# Patient Record
Sex: Female | Born: 1945 | Race: White | Hispanic: No | Marital: Married | Smoking: Former smoker
Health system: Southern US, Community
[De-identification: ages and names within clinical notes are randomized; demographics above are authoritative.]

---

## 2000-10-11 ENCOUNTER — Encounter: Payer: Self-pay | Admitting: Family Medicine

## 2000-10-11 ENCOUNTER — Ambulatory Visit (HOSPITAL_COMMUNITY): Admission: RE | Admit: 2000-10-11 | Discharge: 2000-10-11 | Payer: Self-pay | Admitting: Family Medicine

## 2003-09-01 ENCOUNTER — Other Ambulatory Visit: Admission: RE | Admit: 2003-09-01 | Discharge: 2003-09-01 | Payer: Self-pay | Admitting: Family Medicine

## 2007-01-07 ENCOUNTER — Other Ambulatory Visit: Admission: RE | Admit: 2007-01-07 | Discharge: 2007-01-07 | Payer: Self-pay | Admitting: Family Medicine

## 2010-01-12 ENCOUNTER — Ambulatory Visit (HOSPITAL_COMMUNITY): Admission: RE | Admit: 2010-01-12 | Discharge: 2010-01-12 | Payer: Self-pay | Admitting: Orthopedic Surgery

## 2010-07-20 LAB — BASIC METABOLIC PANEL
BUN: 16 mg/dL (ref 6–23)
CO2: 28 mEq/L (ref 19–32)
Calcium: 9.6 mg/dL (ref 8.4–10.5)
Chloride: 106 mEq/L (ref 96–112)
Creatinine, Ser: 0.69 mg/dL (ref 0.4–1.2)
GFR calc Af Amer: >60 mL/min (ref >60)
GFR calc non Af Amer: >60 mL/min (ref >60)
Glucose, Bld: 106 mg/dL — ABNORMAL HIGH (ref 70–99)
Potassium: 4.3 mEq/L (ref 3.5–5.1)
Sodium: 141 mEq/L (ref 135–145)

## 2010-07-20 LAB — PROTIME-INR
INR: 0.95 (ref 0.00–1.49)
Prothrombin Time: 12.9 seconds (ref 11.6–15.2)

## 2010-07-20 LAB — CBC
HCT: 43.6 % (ref 36.0–46.0)
Hemoglobin: 14.6 g/dL (ref 12.0–15.0)
MCH: 30.9 pg (ref 26.0–34.0)
MCHC: 33.5 g/dL (ref 30.0–36.0)
MCV: 92.4 fL (ref 78.0–100.0)
Platelets: 245 10*3/uL (ref 150–400)
RBC: 4.72 MIL/uL (ref 3.87–5.11)
RDW: 12.3 % (ref 11.5–15.5)
WBC: 6.7 10*3/uL (ref 4.0–10.5)

## 2010-07-20 LAB — SURGICAL PCR SCREEN
MRSA, PCR: NEGATIVE
Staphylococcus aureus: NEGATIVE

## 2012-01-24 ENCOUNTER — Other Ambulatory Visit: Payer: Self-pay | Admitting: Family Medicine

## 2012-01-24 ENCOUNTER — Other Ambulatory Visit (HOSPITAL_COMMUNITY)
Admission: RE | Admit: 2012-01-24 | Discharge: 2012-01-24 | Disposition: A | Discharge status: Home / Self Care | Payer: BC Managed Care – PPO | Source: Ambulatory Visit | Attending: Family Medicine | Admitting: Family Medicine

## 2012-01-24 DIAGNOSIS — Z0000 Encounter for general adult medical examination without abnormal findings: Secondary | ICD-10-CM | POA: Insufficient documentation

## 2013-02-09 DIAGNOSIS — M999 Biomechanical lesion, unspecified: Secondary | ICD-10-CM | POA: Diagnosis not present

## 2013-02-09 DIAGNOSIS — M542 Cervicalgia: Secondary | ICD-10-CM | POA: Diagnosis not present

## 2013-02-09 DIAGNOSIS — M5137 Other intervertebral disc degeneration, lumbosacral region: Secondary | ICD-10-CM | POA: Diagnosis not present

## 2013-02-09 DIAGNOSIS — M545 Low back pain, unspecified: Secondary | ICD-10-CM | POA: Diagnosis not present

## 2013-02-09 DIAGNOSIS — M9981 Other biomechanical lesions of cervical region: Secondary | ICD-10-CM | POA: Diagnosis not present

## 2013-02-19 DIAGNOSIS — M999 Biomechanical lesion, unspecified: Secondary | ICD-10-CM | POA: Diagnosis not present

## 2013-02-19 DIAGNOSIS — M545 Low back pain, unspecified: Secondary | ICD-10-CM | POA: Diagnosis not present

## 2013-02-19 DIAGNOSIS — M5137 Other intervertebral disc degeneration, lumbosacral region: Secondary | ICD-10-CM | POA: Diagnosis not present

## 2013-02-19 DIAGNOSIS — M542 Cervicalgia: Secondary | ICD-10-CM | POA: Diagnosis not present

## 2013-02-19 DIAGNOSIS — M9981 Other biomechanical lesions of cervical region: Secondary | ICD-10-CM | POA: Diagnosis not present

## 2013-03-06 DIAGNOSIS — Z23 Encounter for immunization: Secondary | ICD-10-CM | POA: Diagnosis not present

## 2013-03-09 DIAGNOSIS — M545 Low back pain, unspecified: Secondary | ICD-10-CM | POA: Diagnosis not present

## 2013-03-09 DIAGNOSIS — M9981 Other biomechanical lesions of cervical region: Secondary | ICD-10-CM | POA: Diagnosis not present

## 2013-03-09 DIAGNOSIS — M5137 Other intervertebral disc degeneration, lumbosacral region: Secondary | ICD-10-CM | POA: Diagnosis not present

## 2013-03-09 DIAGNOSIS — M999 Biomechanical lesion, unspecified: Secondary | ICD-10-CM | POA: Diagnosis not present

## 2013-03-09 DIAGNOSIS — M542 Cervicalgia: Secondary | ICD-10-CM | POA: Diagnosis not present

## 2013-03-26 DIAGNOSIS — H6690 Otitis media, unspecified, unspecified ear: Secondary | ICD-10-CM | POA: Diagnosis not present

## 2013-03-26 DIAGNOSIS — J019 Acute sinusitis, unspecified: Secondary | ICD-10-CM | POA: Diagnosis not present

## 2013-03-31 DIAGNOSIS — M999 Biomechanical lesion, unspecified: Secondary | ICD-10-CM | POA: Diagnosis not present

## 2013-03-31 DIAGNOSIS — M545 Low back pain, unspecified: Secondary | ICD-10-CM | POA: Diagnosis not present

## 2013-03-31 DIAGNOSIS — M5137 Other intervertebral disc degeneration, lumbosacral region: Secondary | ICD-10-CM | POA: Diagnosis not present

## 2013-03-31 DIAGNOSIS — M542 Cervicalgia: Secondary | ICD-10-CM | POA: Diagnosis not present

## 2013-03-31 DIAGNOSIS — M9981 Other biomechanical lesions of cervical region: Secondary | ICD-10-CM | POA: Diagnosis not present

## 2013-07-29 DIAGNOSIS — M545 Low back pain, unspecified: Secondary | ICD-10-CM | POA: Diagnosis not present

## 2013-07-29 DIAGNOSIS — M999 Biomechanical lesion, unspecified: Secondary | ICD-10-CM | POA: Diagnosis not present

## 2013-07-29 DIAGNOSIS — M9981 Other biomechanical lesions of cervical region: Secondary | ICD-10-CM | POA: Diagnosis not present

## 2013-07-29 DIAGNOSIS — M5137 Other intervertebral disc degeneration, lumbosacral region: Secondary | ICD-10-CM | POA: Diagnosis not present

## 2013-07-29 DIAGNOSIS — M542 Cervicalgia: Secondary | ICD-10-CM | POA: Diagnosis not present

## 2013-08-03 DIAGNOSIS — M9981 Other biomechanical lesions of cervical region: Secondary | ICD-10-CM | POA: Diagnosis not present

## 2013-08-03 DIAGNOSIS — M542 Cervicalgia: Secondary | ICD-10-CM | POA: Diagnosis not present

## 2013-08-03 DIAGNOSIS — M999 Biomechanical lesion, unspecified: Secondary | ICD-10-CM | POA: Diagnosis not present

## 2013-08-03 DIAGNOSIS — M5137 Other intervertebral disc degeneration, lumbosacral region: Secondary | ICD-10-CM | POA: Diagnosis not present

## 2013-08-03 DIAGNOSIS — M545 Low back pain, unspecified: Secondary | ICD-10-CM | POA: Diagnosis not present

## 2013-10-05 DIAGNOSIS — Z1231 Encounter for screening mammogram for malignant neoplasm of breast: Secondary | ICD-10-CM | POA: Diagnosis not present

## 2013-10-21 DIAGNOSIS — K219 Gastro-esophageal reflux disease without esophagitis: Secondary | ICD-10-CM | POA: Diagnosis not present

## 2013-10-21 DIAGNOSIS — Z0000 Encounter for general adult medical examination without abnormal findings: Secondary | ICD-10-CM | POA: Diagnosis not present

## 2013-10-21 DIAGNOSIS — M949 Disorder of cartilage, unspecified: Secondary | ICD-10-CM | POA: Diagnosis not present

## 2013-10-21 DIAGNOSIS — E559 Vitamin D deficiency, unspecified: Secondary | ICD-10-CM | POA: Diagnosis not present

## 2013-10-21 DIAGNOSIS — M899 Disorder of bone, unspecified: Secondary | ICD-10-CM | POA: Diagnosis not present

## 2013-10-21 DIAGNOSIS — E782 Mixed hyperlipidemia: Secondary | ICD-10-CM | POA: Diagnosis not present

## 2013-10-21 DIAGNOSIS — Z1211 Encounter for screening for malignant neoplasm of colon: Secondary | ICD-10-CM | POA: Diagnosis not present

## 2013-10-21 DIAGNOSIS — Z1331 Encounter for screening for depression: Secondary | ICD-10-CM | POA: Diagnosis not present

## 2013-10-21 DIAGNOSIS — F411 Generalized anxiety disorder: Secondary | ICD-10-CM | POA: Diagnosis not present

## 2013-10-23 DIAGNOSIS — Z23 Encounter for immunization: Secondary | ICD-10-CM | POA: Diagnosis not present

## 2013-11-05 ENCOUNTER — Other Ambulatory Visit: Payer: Self-pay | Admitting: Obstetrics & Gynecology

## 2013-11-05 DIAGNOSIS — N7689 Other specified inflammation of vagina and vulva: Secondary | ICD-10-CM | POA: Diagnosis not present

## 2013-11-05 DIAGNOSIS — Z01419 Encounter for gynecological examination (general) (routine) without abnormal findings: Secondary | ICD-10-CM | POA: Diagnosis not present

## 2013-11-05 DIAGNOSIS — N949 Unspecified condition associated with female genital organs and menstrual cycle: Secondary | ICD-10-CM | POA: Diagnosis not present

## 2013-11-05 DIAGNOSIS — M899 Disorder of bone, unspecified: Secondary | ICD-10-CM | POA: Diagnosis not present

## 2013-11-05 DIAGNOSIS — N72 Inflammatory disease of cervix uteri: Secondary | ICD-10-CM | POA: Diagnosis not present

## 2013-11-05 DIAGNOSIS — M949 Disorder of cartilage, unspecified: Secondary | ICD-10-CM | POA: Diagnosis not present

## 2013-11-05 DIAGNOSIS — L940 Localized scleroderma [morphea]: Secondary | ICD-10-CM | POA: Diagnosis not present

## 2013-11-13 DIAGNOSIS — K3189 Other diseases of stomach and duodenum: Secondary | ICD-10-CM | POA: Diagnosis not present

## 2013-11-13 DIAGNOSIS — H900 Conductive hearing loss, bilateral: Secondary | ICD-10-CM | POA: Diagnosis not present

## 2013-11-13 DIAGNOSIS — K219 Gastro-esophageal reflux disease without esophagitis: Secondary | ICD-10-CM | POA: Diagnosis not present

## 2013-11-13 DIAGNOSIS — Z1211 Encounter for screening for malignant neoplasm of colon: Secondary | ICD-10-CM | POA: Diagnosis not present

## 2013-12-11 DIAGNOSIS — N949 Unspecified condition associated with female genital organs and menstrual cycle: Secondary | ICD-10-CM | POA: Diagnosis not present

## 2013-12-11 DIAGNOSIS — L940 Localized scleroderma [morphea]: Secondary | ICD-10-CM | POA: Diagnosis not present

## 2013-12-18 ENCOUNTER — Other Ambulatory Visit: Payer: Self-pay | Admitting: Gastroenterology

## 2013-12-18 DIAGNOSIS — K229 Disease of esophagus, unspecified: Secondary | ICD-10-CM | POA: Diagnosis not present

## 2013-12-18 DIAGNOSIS — D131 Benign neoplasm of stomach: Secondary | ICD-10-CM | POA: Diagnosis not present

## 2013-12-18 DIAGNOSIS — R1013 Epigastric pain: Secondary | ICD-10-CM | POA: Diagnosis not present

## 2013-12-18 DIAGNOSIS — K219 Gastro-esophageal reflux disease without esophagitis: Secondary | ICD-10-CM | POA: Diagnosis not present

## 2013-12-21 DIAGNOSIS — N949 Unspecified condition associated with female genital organs and menstrual cycle: Secondary | ICD-10-CM | POA: Diagnosis not present

## 2013-12-23 DIAGNOSIS — E559 Vitamin D deficiency, unspecified: Secondary | ICD-10-CM | POA: Diagnosis not present

## 2014-02-21 DIAGNOSIS — Z23 Encounter for immunization: Secondary | ICD-10-CM | POA: Diagnosis not present

## 2014-03-02 DIAGNOSIS — D239 Other benign neoplasm of skin, unspecified: Secondary | ICD-10-CM | POA: Diagnosis not present

## 2014-03-02 DIAGNOSIS — L900 Lichen sclerosus et atrophicus: Secondary | ICD-10-CM | POA: Diagnosis not present

## 2014-04-09 DIAGNOSIS — L820 Inflamed seborrheic keratosis: Secondary | ICD-10-CM | POA: Diagnosis not present

## 2014-06-11 DIAGNOSIS — E669 Obesity, unspecified: Secondary | ICD-10-CM | POA: Diagnosis not present

## 2014-06-11 DIAGNOSIS — Z6833 Body mass index (BMI) 33.0-33.9, adult: Secondary | ICD-10-CM | POA: Diagnosis not present

## 2014-06-11 DIAGNOSIS — Z713 Dietary counseling and surveillance: Secondary | ICD-10-CM | POA: Diagnosis not present

## 2014-06-11 DIAGNOSIS — R102 Pelvic and perineal pain: Secondary | ICD-10-CM | POA: Diagnosis not present

## 2014-06-11 DIAGNOSIS — L900 Lichen sclerosus et atrophicus: Secondary | ICD-10-CM | POA: Diagnosis not present

## 2014-06-24 DIAGNOSIS — K219 Gastro-esophageal reflux disease without esophagitis: Secondary | ICD-10-CM | POA: Diagnosis not present

## 2014-07-06 DIAGNOSIS — M9901 Segmental and somatic dysfunction of cervical region: Secondary | ICD-10-CM | POA: Diagnosis not present

## 2014-07-06 DIAGNOSIS — M542 Cervicalgia: Secondary | ICD-10-CM | POA: Diagnosis not present

## 2014-07-06 DIAGNOSIS — M9904 Segmental and somatic dysfunction of sacral region: Secondary | ICD-10-CM | POA: Diagnosis not present

## 2014-07-06 DIAGNOSIS — M545 Low back pain: Secondary | ICD-10-CM | POA: Diagnosis not present

## 2014-07-06 DIAGNOSIS — M9903 Segmental and somatic dysfunction of lumbar region: Secondary | ICD-10-CM | POA: Diagnosis not present

## 2014-07-06 DIAGNOSIS — M5136 Other intervertebral disc degeneration, lumbar region: Secondary | ICD-10-CM | POA: Diagnosis not present

## 2014-07-07 DIAGNOSIS — M545 Low back pain: Secondary | ICD-10-CM | POA: Diagnosis not present

## 2014-07-07 DIAGNOSIS — M542 Cervicalgia: Secondary | ICD-10-CM | POA: Diagnosis not present

## 2014-07-07 DIAGNOSIS — M9901 Segmental and somatic dysfunction of cervical region: Secondary | ICD-10-CM | POA: Diagnosis not present

## 2014-07-07 DIAGNOSIS — M9903 Segmental and somatic dysfunction of lumbar region: Secondary | ICD-10-CM | POA: Diagnosis not present

## 2014-07-07 DIAGNOSIS — M9904 Segmental and somatic dysfunction of sacral region: Secondary | ICD-10-CM | POA: Diagnosis not present

## 2014-07-07 DIAGNOSIS — M5136 Other intervertebral disc degeneration, lumbar region: Secondary | ICD-10-CM | POA: Diagnosis not present

## 2014-07-08 DIAGNOSIS — H2513 Age-related nuclear cataract, bilateral: Secondary | ICD-10-CM | POA: Diagnosis not present

## 2014-07-08 DIAGNOSIS — H02834 Dermatochalasis of left upper eyelid: Secondary | ICD-10-CM | POA: Diagnosis not present

## 2014-07-08 DIAGNOSIS — H5203 Hypermetropia, bilateral: Secondary | ICD-10-CM | POA: Diagnosis not present

## 2014-07-08 DIAGNOSIS — H02831 Dermatochalasis of right upper eyelid: Secondary | ICD-10-CM | POA: Diagnosis not present

## 2014-07-08 DIAGNOSIS — H52223 Regular astigmatism, bilateral: Secondary | ICD-10-CM | POA: Diagnosis not present

## 2014-07-08 DIAGNOSIS — H524 Presbyopia: Secondary | ICD-10-CM | POA: Diagnosis not present

## 2014-07-12 DIAGNOSIS — M9904 Segmental and somatic dysfunction of sacral region: Secondary | ICD-10-CM | POA: Diagnosis not present

## 2014-07-12 DIAGNOSIS — M9903 Segmental and somatic dysfunction of lumbar region: Secondary | ICD-10-CM | POA: Diagnosis not present

## 2014-07-12 DIAGNOSIS — M545 Low back pain: Secondary | ICD-10-CM | POA: Diagnosis not present

## 2014-07-12 DIAGNOSIS — M5136 Other intervertebral disc degeneration, lumbar region: Secondary | ICD-10-CM | POA: Diagnosis not present

## 2014-07-12 DIAGNOSIS — M9901 Segmental and somatic dysfunction of cervical region: Secondary | ICD-10-CM | POA: Diagnosis not present

## 2014-07-12 DIAGNOSIS — M542 Cervicalgia: Secondary | ICD-10-CM | POA: Diagnosis not present

## 2014-07-13 DIAGNOSIS — M545 Low back pain: Secondary | ICD-10-CM | POA: Diagnosis not present

## 2014-07-13 DIAGNOSIS — M542 Cervicalgia: Secondary | ICD-10-CM | POA: Diagnosis not present

## 2014-07-13 DIAGNOSIS — M9903 Segmental and somatic dysfunction of lumbar region: Secondary | ICD-10-CM | POA: Diagnosis not present

## 2014-07-13 DIAGNOSIS — M9904 Segmental and somatic dysfunction of sacral region: Secondary | ICD-10-CM | POA: Diagnosis not present

## 2014-07-13 DIAGNOSIS — M5136 Other intervertebral disc degeneration, lumbar region: Secondary | ICD-10-CM | POA: Diagnosis not present

## 2014-07-13 DIAGNOSIS — M9901 Segmental and somatic dysfunction of cervical region: Secondary | ICD-10-CM | POA: Diagnosis not present

## 2014-07-15 DIAGNOSIS — H02831 Dermatochalasis of right upper eyelid: Secondary | ICD-10-CM | POA: Diagnosis not present

## 2014-07-15 DIAGNOSIS — H02834 Dermatochalasis of left upper eyelid: Secondary | ICD-10-CM | POA: Diagnosis not present

## 2014-07-19 DIAGNOSIS — M5136 Other intervertebral disc degeneration, lumbar region: Secondary | ICD-10-CM | POA: Diagnosis not present

## 2014-07-19 DIAGNOSIS — M9903 Segmental and somatic dysfunction of lumbar region: Secondary | ICD-10-CM | POA: Diagnosis not present

## 2014-07-19 DIAGNOSIS — M9904 Segmental and somatic dysfunction of sacral region: Secondary | ICD-10-CM | POA: Diagnosis not present

## 2014-07-19 DIAGNOSIS — M545 Low back pain: Secondary | ICD-10-CM | POA: Diagnosis not present

## 2014-07-19 DIAGNOSIS — M9901 Segmental and somatic dysfunction of cervical region: Secondary | ICD-10-CM | POA: Diagnosis not present

## 2014-07-19 DIAGNOSIS — M542 Cervicalgia: Secondary | ICD-10-CM | POA: Diagnosis not present

## 2014-07-21 DIAGNOSIS — M9903 Segmental and somatic dysfunction of lumbar region: Secondary | ICD-10-CM | POA: Diagnosis not present

## 2014-07-21 DIAGNOSIS — M9901 Segmental and somatic dysfunction of cervical region: Secondary | ICD-10-CM | POA: Diagnosis not present

## 2014-07-21 DIAGNOSIS — M9904 Segmental and somatic dysfunction of sacral region: Secondary | ICD-10-CM | POA: Diagnosis not present

## 2014-07-21 DIAGNOSIS — M5136 Other intervertebral disc degeneration, lumbar region: Secondary | ICD-10-CM | POA: Diagnosis not present

## 2014-07-21 DIAGNOSIS — M545 Low back pain: Secondary | ICD-10-CM | POA: Diagnosis not present

## 2014-07-21 DIAGNOSIS — M542 Cervicalgia: Secondary | ICD-10-CM | POA: Diagnosis not present

## 2014-07-27 DIAGNOSIS — M5136 Other intervertebral disc degeneration, lumbar region: Secondary | ICD-10-CM | POA: Diagnosis not present

## 2014-07-27 DIAGNOSIS — M9903 Segmental and somatic dysfunction of lumbar region: Secondary | ICD-10-CM | POA: Diagnosis not present

## 2014-07-27 DIAGNOSIS — M542 Cervicalgia: Secondary | ICD-10-CM | POA: Diagnosis not present

## 2014-07-27 DIAGNOSIS — M9904 Segmental and somatic dysfunction of sacral region: Secondary | ICD-10-CM | POA: Diagnosis not present

## 2014-07-27 DIAGNOSIS — M9901 Segmental and somatic dysfunction of cervical region: Secondary | ICD-10-CM | POA: Diagnosis not present

## 2014-07-27 DIAGNOSIS — M545 Low back pain: Secondary | ICD-10-CM | POA: Diagnosis not present

## 2014-08-03 DIAGNOSIS — M9903 Segmental and somatic dysfunction of lumbar region: Secondary | ICD-10-CM | POA: Diagnosis not present

## 2014-08-03 DIAGNOSIS — M9901 Segmental and somatic dysfunction of cervical region: Secondary | ICD-10-CM | POA: Diagnosis not present

## 2014-08-03 DIAGNOSIS — M5136 Other intervertebral disc degeneration, lumbar region: Secondary | ICD-10-CM | POA: Diagnosis not present

## 2014-08-03 DIAGNOSIS — M545 Low back pain: Secondary | ICD-10-CM | POA: Diagnosis not present

## 2014-08-03 DIAGNOSIS — M542 Cervicalgia: Secondary | ICD-10-CM | POA: Diagnosis not present

## 2014-08-03 DIAGNOSIS — M9904 Segmental and somatic dysfunction of sacral region: Secondary | ICD-10-CM | POA: Diagnosis not present

## 2014-08-11 DIAGNOSIS — M5136 Other intervertebral disc degeneration, lumbar region: Secondary | ICD-10-CM | POA: Diagnosis not present

## 2014-08-11 DIAGNOSIS — M545 Low back pain: Secondary | ICD-10-CM | POA: Diagnosis not present

## 2014-08-11 DIAGNOSIS — M9903 Segmental and somatic dysfunction of lumbar region: Secondary | ICD-10-CM | POA: Diagnosis not present

## 2014-08-11 DIAGNOSIS — M9904 Segmental and somatic dysfunction of sacral region: Secondary | ICD-10-CM | POA: Diagnosis not present

## 2014-08-11 DIAGNOSIS — M9901 Segmental and somatic dysfunction of cervical region: Secondary | ICD-10-CM | POA: Diagnosis not present

## 2014-08-11 DIAGNOSIS — M542 Cervicalgia: Secondary | ICD-10-CM | POA: Diagnosis not present

## 2014-09-06 DIAGNOSIS — H02403 Unspecified ptosis of bilateral eyelids: Secondary | ICD-10-CM | POA: Diagnosis not present

## 2014-10-11 DIAGNOSIS — H02423 Myogenic ptosis of bilateral eyelids: Secondary | ICD-10-CM | POA: Diagnosis not present

## 2014-10-11 DIAGNOSIS — H02834 Dermatochalasis of left upper eyelid: Secondary | ICD-10-CM | POA: Diagnosis not present

## 2014-10-11 DIAGNOSIS — H02831 Dermatochalasis of right upper eyelid: Secondary | ICD-10-CM | POA: Diagnosis not present

## 2014-10-11 DIAGNOSIS — H02403 Unspecified ptosis of bilateral eyelids: Secondary | ICD-10-CM | POA: Diagnosis not present

## 2014-11-30 DIAGNOSIS — Z1231 Encounter for screening mammogram for malignant neoplasm of breast: Secondary | ICD-10-CM | POA: Diagnosis not present

## 2015-01-05 DIAGNOSIS — M545 Low back pain: Secondary | ICD-10-CM | POA: Diagnosis not present

## 2015-01-05 DIAGNOSIS — M9901 Segmental and somatic dysfunction of cervical region: Secondary | ICD-10-CM | POA: Diagnosis not present

## 2015-01-05 DIAGNOSIS — M542 Cervicalgia: Secondary | ICD-10-CM | POA: Diagnosis not present

## 2015-01-05 DIAGNOSIS — M5136 Other intervertebral disc degeneration, lumbar region: Secondary | ICD-10-CM | POA: Diagnosis not present

## 2015-01-05 DIAGNOSIS — M9903 Segmental and somatic dysfunction of lumbar region: Secondary | ICD-10-CM | POA: Diagnosis not present

## 2015-01-05 DIAGNOSIS — M9904 Segmental and somatic dysfunction of sacral region: Secondary | ICD-10-CM | POA: Diagnosis not present

## 2015-01-06 DIAGNOSIS — M542 Cervicalgia: Secondary | ICD-10-CM | POA: Diagnosis not present

## 2015-01-06 DIAGNOSIS — M9904 Segmental and somatic dysfunction of sacral region: Secondary | ICD-10-CM | POA: Diagnosis not present

## 2015-01-06 DIAGNOSIS — M9901 Segmental and somatic dysfunction of cervical region: Secondary | ICD-10-CM | POA: Diagnosis not present

## 2015-01-06 DIAGNOSIS — M5136 Other intervertebral disc degeneration, lumbar region: Secondary | ICD-10-CM | POA: Diagnosis not present

## 2015-01-06 DIAGNOSIS — M545 Low back pain: Secondary | ICD-10-CM | POA: Diagnosis not present

## 2015-01-06 DIAGNOSIS — M9903 Segmental and somatic dysfunction of lumbar region: Secondary | ICD-10-CM | POA: Diagnosis not present

## 2015-01-12 DIAGNOSIS — Z23 Encounter for immunization: Secondary | ICD-10-CM | POA: Diagnosis not present

## 2015-01-12 DIAGNOSIS — L237 Allergic contact dermatitis due to plants, except food: Secondary | ICD-10-CM | POA: Diagnosis not present

## 2015-01-31 DIAGNOSIS — L237 Allergic contact dermatitis due to plants, except food: Secondary | ICD-10-CM | POA: Diagnosis not present

## 2015-01-31 DIAGNOSIS — K219 Gastro-esophageal reflux disease without esophagitis: Secondary | ICD-10-CM | POA: Diagnosis not present

## 2015-01-31 DIAGNOSIS — Z1211 Encounter for screening for malignant neoplasm of colon: Secondary | ICD-10-CM | POA: Diagnosis not present

## 2015-02-01 ENCOUNTER — Other Ambulatory Visit: Payer: Self-pay | Admitting: Physician Assistant

## 2015-02-01 DIAGNOSIS — M545 Low back pain: Secondary | ICD-10-CM | POA: Diagnosis not present

## 2015-02-01 DIAGNOSIS — L309 Dermatitis, unspecified: Secondary | ICD-10-CM | POA: Diagnosis not present

## 2015-02-01 DIAGNOSIS — M542 Cervicalgia: Secondary | ICD-10-CM | POA: Diagnosis not present

## 2015-02-01 DIAGNOSIS — M9903 Segmental and somatic dysfunction of lumbar region: Secondary | ICD-10-CM | POA: Diagnosis not present

## 2015-02-01 DIAGNOSIS — D485 Neoplasm of uncertain behavior of skin: Secondary | ICD-10-CM | POA: Diagnosis not present

## 2015-02-01 DIAGNOSIS — M9904 Segmental and somatic dysfunction of sacral region: Secondary | ICD-10-CM | POA: Diagnosis not present

## 2015-02-01 DIAGNOSIS — M5136 Other intervertebral disc degeneration, lumbar region: Secondary | ICD-10-CM | POA: Diagnosis not present

## 2015-02-01 DIAGNOSIS — B078 Other viral warts: Secondary | ICD-10-CM | POA: Diagnosis not present

## 2015-02-01 DIAGNOSIS — M9901 Segmental and somatic dysfunction of cervical region: Secondary | ICD-10-CM | POA: Diagnosis not present

## 2015-03-09 DIAGNOSIS — K219 Gastro-esophageal reflux disease without esophagitis: Secondary | ICD-10-CM | POA: Diagnosis not present

## 2015-03-09 DIAGNOSIS — G4700 Insomnia, unspecified: Secondary | ICD-10-CM | POA: Diagnosis not present

## 2015-03-09 DIAGNOSIS — E559 Vitamin D deficiency, unspecified: Secondary | ICD-10-CM | POA: Diagnosis not present

## 2015-03-09 DIAGNOSIS — M8580 Other specified disorders of bone density and structure, unspecified site: Secondary | ICD-10-CM | POA: Diagnosis not present

## 2015-03-09 DIAGNOSIS — E7800 Pure hypercholesterolemia, unspecified: Secondary | ICD-10-CM | POA: Diagnosis not present

## 2015-03-15 DIAGNOSIS — H8110 Benign paroxysmal vertigo, unspecified ear: Secondary | ICD-10-CM | POA: Diagnosis not present

## 2015-03-15 DIAGNOSIS — J309 Allergic rhinitis, unspecified: Secondary | ICD-10-CM | POA: Diagnosis not present

## 2015-05-27 ENCOUNTER — Other Ambulatory Visit: Payer: Self-pay | Admitting: Physician Assistant

## 2015-05-27 DIAGNOSIS — D485 Neoplasm of uncertain behavior of skin: Secondary | ICD-10-CM | POA: Diagnosis not present

## 2015-05-27 DIAGNOSIS — D1801 Hemangioma of skin and subcutaneous tissue: Secondary | ICD-10-CM | POA: Diagnosis not present

## 2015-05-27 DIAGNOSIS — B079 Viral wart, unspecified: Secondary | ICD-10-CM | POA: Diagnosis not present

## 2015-07-14 DIAGNOSIS — H2513 Age-related nuclear cataract, bilateral: Secondary | ICD-10-CM | POA: Diagnosis not present

## 2015-07-14 DIAGNOSIS — H02831 Dermatochalasis of right upper eyelid: Secondary | ICD-10-CM | POA: Diagnosis not present

## 2015-07-14 DIAGNOSIS — H40003 Preglaucoma, unspecified, bilateral: Secondary | ICD-10-CM | POA: Diagnosis not present

## 2015-07-14 DIAGNOSIS — H02834 Dermatochalasis of left upper eyelid: Secondary | ICD-10-CM | POA: Diagnosis not present

## 2015-07-21 DIAGNOSIS — H40003 Preglaucoma, unspecified, bilateral: Secondary | ICD-10-CM | POA: Diagnosis not present

## 2015-09-08 DIAGNOSIS — E7800 Pure hypercholesterolemia, unspecified: Secondary | ICD-10-CM | POA: Diagnosis not present

## 2015-09-08 DIAGNOSIS — K219 Gastro-esophageal reflux disease without esophagitis: Secondary | ICD-10-CM | POA: Diagnosis not present

## 2015-09-08 DIAGNOSIS — Z0000 Encounter for general adult medical examination without abnormal findings: Secondary | ICD-10-CM | POA: Diagnosis not present

## 2015-10-13 DIAGNOSIS — K219 Gastro-esophageal reflux disease without esophagitis: Secondary | ICD-10-CM | POA: Diagnosis not present

## 2015-10-13 DIAGNOSIS — Z1211 Encounter for screening for malignant neoplasm of colon: Secondary | ICD-10-CM | POA: Diagnosis not present

## 2015-10-13 DIAGNOSIS — K5900 Constipation, unspecified: Secondary | ICD-10-CM | POA: Diagnosis not present

## 2015-11-30 DIAGNOSIS — Z1231 Encounter for screening mammogram for malignant neoplasm of breast: Secondary | ICD-10-CM | POA: Diagnosis not present

## 2015-11-30 DIAGNOSIS — Z803 Family history of malignant neoplasm of breast: Secondary | ICD-10-CM | POA: Diagnosis not present

## 2015-12-01 DIAGNOSIS — K5730 Diverticulosis of large intestine without perforation or abscess without bleeding: Secondary | ICD-10-CM | POA: Diagnosis not present

## 2015-12-01 DIAGNOSIS — D128 Benign neoplasm of rectum: Secondary | ICD-10-CM | POA: Diagnosis not present

## 2015-12-01 DIAGNOSIS — Z1211 Encounter for screening for malignant neoplasm of colon: Secondary | ICD-10-CM | POA: Diagnosis not present

## 2015-12-01 DIAGNOSIS — D126 Benign neoplasm of colon, unspecified: Secondary | ICD-10-CM | POA: Diagnosis not present

## 2015-12-01 DIAGNOSIS — K621 Rectal polyp: Secondary | ICD-10-CM | POA: Diagnosis not present

## 2015-12-01 DIAGNOSIS — K644 Residual hemorrhoidal skin tags: Secondary | ICD-10-CM | POA: Diagnosis not present

## 2016-02-22 DIAGNOSIS — Z23 Encounter for immunization: Secondary | ICD-10-CM | POA: Diagnosis not present

## 2016-02-22 DIAGNOSIS — R3989 Other symptoms and signs involving the genitourinary system: Secondary | ICD-10-CM | POA: Diagnosis not present

## 2016-03-01 DIAGNOSIS — H10211 Acute toxic conjunctivitis, right eye: Secondary | ICD-10-CM | POA: Diagnosis not present

## 2016-03-15 DIAGNOSIS — H2513 Age-related nuclear cataract, bilateral: Secondary | ICD-10-CM | POA: Diagnosis not present

## 2016-03-15 DIAGNOSIS — H40003 Preglaucoma, unspecified, bilateral: Secondary | ICD-10-CM | POA: Diagnosis not present

## 2016-04-22 DIAGNOSIS — J111 Influenza due to unidentified influenza virus with other respiratory manifestations: Secondary | ICD-10-CM | POA: Diagnosis not present

## 2016-08-23 DIAGNOSIS — H25013 Cortical age-related cataract, bilateral: Secondary | ICD-10-CM | POA: Diagnosis not present

## 2016-08-23 DIAGNOSIS — H02831 Dermatochalasis of right upper eyelid: Secondary | ICD-10-CM | POA: Diagnosis not present

## 2016-08-23 DIAGNOSIS — H2513 Age-related nuclear cataract, bilateral: Secondary | ICD-10-CM | POA: Diagnosis not present

## 2016-08-23 DIAGNOSIS — H02834 Dermatochalasis of left upper eyelid: Secondary | ICD-10-CM | POA: Diagnosis not present

## 2016-09-04 ENCOUNTER — Other Ambulatory Visit: Payer: Self-pay | Admitting: Family Medicine

## 2016-09-04 DIAGNOSIS — R109 Unspecified abdominal pain: Secondary | ICD-10-CM | POA: Diagnosis not present

## 2016-09-04 DIAGNOSIS — K5909 Other constipation: Secondary | ICD-10-CM | POA: Diagnosis not present

## 2016-09-05 ENCOUNTER — Other Ambulatory Visit: Payer: Self-pay | Admitting: Family Medicine

## 2016-09-05 DIAGNOSIS — R109 Unspecified abdominal pain: Secondary | ICD-10-CM

## 2016-09-13 ENCOUNTER — Ambulatory Visit
Admission: RE | Admit: 2016-09-13 | Discharge: 2016-09-13 | Disposition: A | Discharge status: Home / Self Care | Payer: Medicare Other | Source: Ambulatory Visit | Attending: Family Medicine | Admitting: Family Medicine

## 2016-09-13 DIAGNOSIS — R109 Unspecified abdominal pain: Secondary | ICD-10-CM

## 2016-09-13 MED ORDER — IOPAMIDOL (ISOVUE-300) INJECTION 61%
100.0000 mL | Freq: Once | INTRAVENOUS | Status: AC | PRN
  Administered 2016-09-13: 100 mL via INTRAVENOUS

## 2016-09-20 DIAGNOSIS — E7800 Pure hypercholesterolemia, unspecified: Secondary | ICD-10-CM | POA: Diagnosis not present

## 2016-09-20 DIAGNOSIS — K219 Gastro-esophageal reflux disease without esophagitis: Secondary | ICD-10-CM | POA: Diagnosis not present

## 2016-09-20 DIAGNOSIS — Z0001 Encounter for general adult medical examination with abnormal findings: Secondary | ICD-10-CM | POA: Diagnosis not present

## 2016-09-20 DIAGNOSIS — R030 Elevated blood-pressure reading, without diagnosis of hypertension: Secondary | ICD-10-CM | POA: Diagnosis not present

## 2016-09-20 DIAGNOSIS — G4700 Insomnia, unspecified: Secondary | ICD-10-CM | POA: Diagnosis not present

## 2016-09-20 DIAGNOSIS — Z23 Encounter for immunization: Secondary | ICD-10-CM | POA: Diagnosis not present

## 2016-09-20 DIAGNOSIS — Z1389 Encounter for screening for other disorder: Secondary | ICD-10-CM | POA: Diagnosis not present

## 2016-10-29 DIAGNOSIS — M47816 Spondylosis without myelopathy or radiculopathy, lumbar region: Secondary | ICD-10-CM | POA: Diagnosis not present

## 2016-10-29 DIAGNOSIS — M545 Low back pain: Secondary | ICD-10-CM | POA: Diagnosis not present

## 2016-10-29 DIAGNOSIS — S3992XA Unspecified injury of lower back, initial encounter: Secondary | ICD-10-CM | POA: Diagnosis not present

## 2016-11-05 DIAGNOSIS — M5136 Other intervertebral disc degeneration, lumbar region: Secondary | ICD-10-CM | POA: Diagnosis not present

## 2016-11-05 DIAGNOSIS — M545 Low back pain: Secondary | ICD-10-CM | POA: Diagnosis not present

## 2016-11-05 DIAGNOSIS — M9903 Segmental and somatic dysfunction of lumbar region: Secondary | ICD-10-CM | POA: Diagnosis not present

## 2016-11-05 DIAGNOSIS — M9901 Segmental and somatic dysfunction of cervical region: Secondary | ICD-10-CM | POA: Diagnosis not present

## 2016-11-05 DIAGNOSIS — M9904 Segmental and somatic dysfunction of sacral region: Secondary | ICD-10-CM | POA: Diagnosis not present

## 2016-11-05 DIAGNOSIS — M542 Cervicalgia: Secondary | ICD-10-CM | POA: Diagnosis not present

## 2016-11-06 DIAGNOSIS — M9903 Segmental and somatic dysfunction of lumbar region: Secondary | ICD-10-CM | POA: Diagnosis not present

## 2016-11-06 DIAGNOSIS — M545 Low back pain: Secondary | ICD-10-CM | POA: Diagnosis not present

## 2016-11-06 DIAGNOSIS — M5136 Other intervertebral disc degeneration, lumbar region: Secondary | ICD-10-CM | POA: Diagnosis not present

## 2016-11-06 DIAGNOSIS — M9901 Segmental and somatic dysfunction of cervical region: Secondary | ICD-10-CM | POA: Diagnosis not present

## 2016-11-06 DIAGNOSIS — M9904 Segmental and somatic dysfunction of sacral region: Secondary | ICD-10-CM | POA: Diagnosis not present

## 2016-11-06 DIAGNOSIS — M542 Cervicalgia: Secondary | ICD-10-CM | POA: Diagnosis not present

## 2016-11-08 DIAGNOSIS — M542 Cervicalgia: Secondary | ICD-10-CM | POA: Diagnosis not present

## 2016-11-08 DIAGNOSIS — M9901 Segmental and somatic dysfunction of cervical region: Secondary | ICD-10-CM | POA: Diagnosis not present

## 2016-11-08 DIAGNOSIS — M9904 Segmental and somatic dysfunction of sacral region: Secondary | ICD-10-CM | POA: Diagnosis not present

## 2016-11-08 DIAGNOSIS — M5136 Other intervertebral disc degeneration, lumbar region: Secondary | ICD-10-CM | POA: Diagnosis not present

## 2016-11-08 DIAGNOSIS — M9903 Segmental and somatic dysfunction of lumbar region: Secondary | ICD-10-CM | POA: Diagnosis not present

## 2016-11-08 DIAGNOSIS — M545 Low back pain: Secondary | ICD-10-CM | POA: Diagnosis not present

## 2016-11-12 DIAGNOSIS — M5136 Other intervertebral disc degeneration, lumbar region: Secondary | ICD-10-CM | POA: Diagnosis not present

## 2016-11-12 DIAGNOSIS — M9901 Segmental and somatic dysfunction of cervical region: Secondary | ICD-10-CM | POA: Diagnosis not present

## 2016-11-12 DIAGNOSIS — M542 Cervicalgia: Secondary | ICD-10-CM | POA: Diagnosis not present

## 2016-11-12 DIAGNOSIS — M9904 Segmental and somatic dysfunction of sacral region: Secondary | ICD-10-CM | POA: Diagnosis not present

## 2016-11-12 DIAGNOSIS — M9903 Segmental and somatic dysfunction of lumbar region: Secondary | ICD-10-CM | POA: Diagnosis not present

## 2016-11-12 DIAGNOSIS — M545 Low back pain: Secondary | ICD-10-CM | POA: Diagnosis not present

## 2016-11-15 DIAGNOSIS — M5136 Other intervertebral disc degeneration, lumbar region: Secondary | ICD-10-CM | POA: Diagnosis not present

## 2016-11-15 DIAGNOSIS — M9903 Segmental and somatic dysfunction of lumbar region: Secondary | ICD-10-CM | POA: Diagnosis not present

## 2016-11-15 DIAGNOSIS — M545 Low back pain: Secondary | ICD-10-CM | POA: Diagnosis not present

## 2016-11-15 DIAGNOSIS — M9904 Segmental and somatic dysfunction of sacral region: Secondary | ICD-10-CM | POA: Diagnosis not present

## 2016-11-15 DIAGNOSIS — M9901 Segmental and somatic dysfunction of cervical region: Secondary | ICD-10-CM | POA: Diagnosis not present

## 2016-11-15 DIAGNOSIS — M542 Cervicalgia: Secondary | ICD-10-CM | POA: Diagnosis not present

## 2016-12-12 DIAGNOSIS — Z803 Family history of malignant neoplasm of breast: Secondary | ICD-10-CM | POA: Diagnosis not present

## 2016-12-12 DIAGNOSIS — Z1231 Encounter for screening mammogram for malignant neoplasm of breast: Secondary | ICD-10-CM | POA: Diagnosis not present

## 2016-12-28 ENCOUNTER — Encounter (INDEPENDENT_AMBULATORY_CARE_PROVIDER_SITE_OTHER): Payer: Self-pay | Admitting: Orthopedic Surgery

## 2016-12-28 ENCOUNTER — Ambulatory Visit (INDEPENDENT_AMBULATORY_CARE_PROVIDER_SITE_OTHER): Payer: Medicare Other

## 2016-12-28 ENCOUNTER — Ambulatory Visit (INDEPENDENT_AMBULATORY_CARE_PROVIDER_SITE_OTHER): Payer: Medicare Other | Admitting: Orthopedic Surgery

## 2016-12-28 DIAGNOSIS — M25551 Pain in right hip: Secondary | ICD-10-CM | POA: Diagnosis not present

## 2016-12-28 DIAGNOSIS — M79604 Pain in right leg: Secondary | ICD-10-CM

## 2016-12-28 DIAGNOSIS — M25561 Pain in right knee: Secondary | ICD-10-CM

## 2016-12-28 MED ORDER — BUPIVACAINE HCL 0.25 % IJ SOLN
4.0000 mL | INTRAMUSCULAR | Status: AC | PRN
  Administered 2016-12-28: 4 mL via INTRA_ARTICULAR

## 2016-12-28 MED ORDER — TRIAMCINOLONE ACETONIDE 40 MG/ML IJ SUSP
40.0000 mg | INTRAMUSCULAR | Status: AC | PRN
  Administered 2016-12-28: 40 mg via INTRA_ARTICULAR

## 2016-12-28 MED ORDER — LIDOCAINE HCL 1 % IJ SOLN
5.0000 mL | INTRAMUSCULAR | Status: AC | PRN
  Administered 2016-12-28: 5 mL

## 2016-12-28 NOTE — Progress Notes (Signed)
Office Visit Note   Patient: Donna Moreno           Date of Birth: 1945-10-28           MRN: [id number] Visit Date: 12/28/2016 Requested by: Carol Ada, MD [place], [place] [address] PCP: Carol Ada, MD  Subjective: Chief Complaint  Patient presents with  . Lower Back - Pain  . Right Hip - Pain  . Right Leg - Pain    HPI: Donna Moreno is a 71 year old patient with right hip and buttock pain.  She states she has a "weak spot" in the right thigh.  Localizes pain in the trochanteric region and distal.  Also reports some occasional radiating pain into the calf and thigh.  Reports some low back pain.  She had a fall off of a barstool in June.  Been having pain since that time.  She also describes right knee popping but no pain.  Describes no weakness or giving way but does have some popping.  Anti-inflammatories do help.  Flexible for exercise but hasn't been able to walk as much because of her "hip" pain.              ROS: All systems reviewed are negative as they relate to the chief complaint within the history of present illness.  Patient denies  fevers or chills.   Assessment & Plan: Visit Diagnoses:  1. Pain in right leg     Plan: Impression is right hip pain which looks like trochanteric bursitis.  To do an ultrasound-guided injection into the hip today.  That was right in the trochanteric bursa.  I think her knee is fine.  Continue with anti-inflammatories.  Quad strengthening exercises encouraged.  Follow-up with me as needed  Follow-Up Instructions: Return if symptoms worsen or fail to improve.   Orders:  Orders Placed This Encounter  Procedures  . XR KNEE 3 VIEW RIGHT  . XR HIP UNILAT W OR W/O PELVIS 2-3 VIEWS RIGHT   No orders of the defined types were placed in this encounter.     Procedures: Large Joint Inj Date/Time: 12/28/2016 2:57 PM Performed by: Meredith Pel Authorized by: Meredith Pel   Consent Given by:   Patient Site marked: the procedure site was marked   Timeout: prior to procedure the correct patient, procedure, and site was verified   Indications:  Pain and diagnostic evaluation Location:  Hip Site:  R greater trochanter Prep: patient was prepped and draped in usual sterile fashion   Needle Size:  18 G Needle Length:  3.5 inches Approach:  Lateral Ultrasound Guidance: Yes   Fluoroscopic Guidance: No   Arthrogram: No   Medications:  5 mL lidocaine 1 %; 40 mg triamcinolone acetonide 40 MG/ML; 4 mL bupivacaine 0.25 % Aspiration Attempted: No   Patient tolerance:  Patient tolerated the procedure well with no immediate complications     Clinical Data: No additional findings.  Objective: Vital Signs: There were no vitals taken for this visit.  Physical Exam:   Constitutional: Patient appears well-developed HEENT:  Head: Normocephalic Eyes:EOM are normal Neck: Normal range of motion Cardiovascular: Normal rate Pulmonary/chest: Effort normal Neurologic: Patient is alert Skin: Skin is warm Psychiatric: Patient has normal mood and affect    Ortho Exam: Orthopedic exam demonstrates full active and passive range of motion of the right left knee with not much in the way of patella femoral crepitus.  There is no effusion in the right  knee collateral and cruciate ligaments are stable.  Pedal pulses palpable.  No other masses lymph adenopathy or skin changes noted in the knee region.  Right hip is examined.  She does have trochanteric tenderness on the right but, on the left.  No groin pain with internal/external rotation of either leg.  Does have good hip flexion strength.  No other masses lymph adenopathy or skin changes noted in the right leg region.  No tenderness over the iliotibial band around the distal aspect of the right knee  Specialty Comments:  No specialty comments available.  Imaging: Xr Hip Unilat W Or W/o Pelvis 2-3 Views Right  Result Date: 12/28/2016 AP pelvis  lateral right hip reviewed.  Enthesopathic changes noted around both trochanters.  No fractures noted.  No significant hip arthritis is present.  No other soft tissue calcifications noted in the bony pelvis.  Visualized lumbar spine normal.  Xr Knee 3 View Right  Result Date: 12/28/2016 AP lateral merchant right knee reviewed.  No loose bodies present.  Minimal to no degenerative changes present.  Patella well aligned in the trochlear groove.  No effusion.  Alignment normal.  Bone quality normal.    PMFS History: There are no active problems to display for this patient.  No past medical history on file.  No family history on file.  No past surgical history on file. Social History   Occupational History  . Not on file.   Social History Main Topics  . Smoking status: Former Research scientist (life sciences)  . Smokeless tobacco: Never Used  . Alcohol use Not on file  . Drug use: Unknown  . Sexual activity: Not on file

## 2017-02-20 DIAGNOSIS — Z23 Encounter for immunization: Secondary | ICD-10-CM | POA: Diagnosis not present

## 2017-03-14 DIAGNOSIS — L900 Lichen sclerosus et atrophicus: Secondary | ICD-10-CM | POA: Diagnosis not present

## 2017-05-01 ENCOUNTER — Ambulatory Visit (INDEPENDENT_AMBULATORY_CARE_PROVIDER_SITE_OTHER): Payer: Medicare Other | Admitting: Orthopedic Surgery

## 2017-05-01 ENCOUNTER — Encounter (INDEPENDENT_AMBULATORY_CARE_PROVIDER_SITE_OTHER): Payer: Self-pay | Admitting: Orthopedic Surgery

## 2017-05-01 DIAGNOSIS — M7061 Trochanteric bursitis, right hip: Secondary | ICD-10-CM | POA: Diagnosis not present

## 2017-05-01 NOTE — Progress Notes (Signed)
   Office Visit Note   Patient: Donna Moreno           Date of Birth: 02-18-1946           MRN: [id number] Visit Date: 05/01/2017 Requested by: Carol Ada, MD [place], [place] [address] PCP: Carol Ada, MD  Subjective: Chief Complaint  Patient presents with  . Right Hip - Pain    HPI: Donna Moreno is a patient with right hip pain.  She had an injection in August which helped her until October.  She was able to do well getting to [facility].  In October she had to go up and down 24 flights of stairs in her hip pain recurred.  She had a trochanteric injection in August which did give her very good relief.  She has been taking over-the-counter medications.  She is requesting another trochanteric injection today              ROS: All systems reviewed are negative as they relate to the chief complaint within the history of present illness.  Patient denies  fevers or chills.   Assessment & Plan: Visit Diagnoses: No diagnosis found.  Plan: Impression is symptomatic right hip trochanteric bursitis.  Plan is ultrasound-guided injection today.  Iliotibial band stretching encouraged.  This does not look like it is occult hip arthritis or back related but those are 2 things to keep in mind in case further workup is needed.  Follow-Up Instructions: No Follow-up on file.   Orders:  No orders of the defined types were placed in this encounter.  No orders of the defined types were placed in this encounter.     Procedures: No procedures performed   Clinical Data: No additional findings.  Objective: Vital Signs: There were no vitals taken for this visit.  Physical Exam:   Constitutional: Patient appears well-developed HEENT:  Head: Normocephalic Eyes:EOM are normal Neck: Normal range of motion Cardiovascular: Normal rate Pulmonary/chest: Effort normal Neurologic: Patient is alert Skin: Skin is warm Psychiatric: Patient has normal mood and  affect    Ortho Exam: Orthopedic exam demonstrates full active and passive range of motion of the hip with no discrete groin pain with internal/external rotation on the right-hand side.  No paresthesias in the right leg.  Pedal pulses palpable.  There is trochanteric tenderness on the right compared to the left.  No other masses lymphadenopathy or skin changes noted in the right hip region  Specialty Comments:  No specialty comments available.  Imaging: No results found.   PMFS History: There are no active problems to display for this patient.  History reviewed. No pertinent past medical history.  History reviewed. No pertinent family history.  History reviewed. No pertinent surgical history. Social History   Occupational History  . Not on file  Tobacco Use  . Smoking status: Former Research scientist (life sciences)  . Smokeless tobacco: Never Used  Substance and Sexual Activity  . Alcohol use: Not on file  . Drug use: Not on file  . Sexual activity: Not on file

## 2017-07-18 DIAGNOSIS — R109 Unspecified abdominal pain: Secondary | ICD-10-CM | POA: Diagnosis not present

## 2017-07-18 DIAGNOSIS — I10 Essential (primary) hypertension: Secondary | ICD-10-CM | POA: Diagnosis not present

## 2017-07-18 DIAGNOSIS — R0781 Pleurodynia: Secondary | ICD-10-CM | POA: Diagnosis not present

## 2017-08-20 DIAGNOSIS — M5136 Other intervertebral disc degeneration, lumbar region: Secondary | ICD-10-CM | POA: Diagnosis not present

## 2017-08-20 DIAGNOSIS — M545 Low back pain: Secondary | ICD-10-CM | POA: Diagnosis not present

## 2017-08-20 DIAGNOSIS — M9901 Segmental and somatic dysfunction of cervical region: Secondary | ICD-10-CM | POA: Diagnosis not present

## 2017-08-20 DIAGNOSIS — M9904 Segmental and somatic dysfunction of sacral region: Secondary | ICD-10-CM | POA: Diagnosis not present

## 2017-08-20 DIAGNOSIS — M542 Cervicalgia: Secondary | ICD-10-CM | POA: Diagnosis not present

## 2017-08-20 DIAGNOSIS — M9903 Segmental and somatic dysfunction of lumbar region: Secondary | ICD-10-CM | POA: Diagnosis not present

## 2017-08-21 DIAGNOSIS — M5136 Other intervertebral disc degeneration, lumbar region: Secondary | ICD-10-CM | POA: Diagnosis not present

## 2017-08-21 DIAGNOSIS — M9901 Segmental and somatic dysfunction of cervical region: Secondary | ICD-10-CM | POA: Diagnosis not present

## 2017-08-21 DIAGNOSIS — M542 Cervicalgia: Secondary | ICD-10-CM | POA: Diagnosis not present

## 2017-08-21 DIAGNOSIS — M545 Low back pain: Secondary | ICD-10-CM | POA: Diagnosis not present

## 2017-08-21 DIAGNOSIS — M9904 Segmental and somatic dysfunction of sacral region: Secondary | ICD-10-CM | POA: Diagnosis not present

## 2017-08-21 DIAGNOSIS — M9903 Segmental and somatic dysfunction of lumbar region: Secondary | ICD-10-CM | POA: Diagnosis not present

## 2017-08-22 DIAGNOSIS — M9904 Segmental and somatic dysfunction of sacral region: Secondary | ICD-10-CM | POA: Diagnosis not present

## 2017-08-22 DIAGNOSIS — M545 Low back pain: Secondary | ICD-10-CM | POA: Diagnosis not present

## 2017-08-22 DIAGNOSIS — M9901 Segmental and somatic dysfunction of cervical region: Secondary | ICD-10-CM | POA: Diagnosis not present

## 2017-08-22 DIAGNOSIS — M5136 Other intervertebral disc degeneration, lumbar region: Secondary | ICD-10-CM | POA: Diagnosis not present

## 2017-08-22 DIAGNOSIS — M9903 Segmental and somatic dysfunction of lumbar region: Secondary | ICD-10-CM | POA: Diagnosis not present

## 2017-08-22 DIAGNOSIS — M542 Cervicalgia: Secondary | ICD-10-CM | POA: Diagnosis not present

## 2017-08-27 DIAGNOSIS — M9901 Segmental and somatic dysfunction of cervical region: Secondary | ICD-10-CM | POA: Diagnosis not present

## 2017-08-27 DIAGNOSIS — M9903 Segmental and somatic dysfunction of lumbar region: Secondary | ICD-10-CM | POA: Diagnosis not present

## 2017-08-27 DIAGNOSIS — M9904 Segmental and somatic dysfunction of sacral region: Secondary | ICD-10-CM | POA: Diagnosis not present

## 2017-08-27 DIAGNOSIS — M545 Low back pain: Secondary | ICD-10-CM | POA: Diagnosis not present

## 2017-08-27 DIAGNOSIS — M542 Cervicalgia: Secondary | ICD-10-CM | POA: Diagnosis not present

## 2017-08-27 DIAGNOSIS — M5136 Other intervertebral disc degeneration, lumbar region: Secondary | ICD-10-CM | POA: Diagnosis not present

## 2017-08-28 DIAGNOSIS — M9901 Segmental and somatic dysfunction of cervical region: Secondary | ICD-10-CM | POA: Diagnosis not present

## 2017-08-28 DIAGNOSIS — M9903 Segmental and somatic dysfunction of lumbar region: Secondary | ICD-10-CM | POA: Diagnosis not present

## 2017-08-28 DIAGNOSIS — M5136 Other intervertebral disc degeneration, lumbar region: Secondary | ICD-10-CM | POA: Diagnosis not present

## 2017-08-28 DIAGNOSIS — M9904 Segmental and somatic dysfunction of sacral region: Secondary | ICD-10-CM | POA: Diagnosis not present

## 2017-08-28 DIAGNOSIS — M545 Low back pain: Secondary | ICD-10-CM | POA: Diagnosis not present

## 2017-08-28 DIAGNOSIS — M542 Cervicalgia: Secondary | ICD-10-CM | POA: Diagnosis not present

## 2017-09-02 DIAGNOSIS — M9904 Segmental and somatic dysfunction of sacral region: Secondary | ICD-10-CM | POA: Diagnosis not present

## 2017-09-02 DIAGNOSIS — M9901 Segmental and somatic dysfunction of cervical region: Secondary | ICD-10-CM | POA: Diagnosis not present

## 2017-09-02 DIAGNOSIS — M545 Low back pain: Secondary | ICD-10-CM | POA: Diagnosis not present

## 2017-09-02 DIAGNOSIS — M9903 Segmental and somatic dysfunction of lumbar region: Secondary | ICD-10-CM | POA: Diagnosis not present

## 2017-09-02 DIAGNOSIS — M542 Cervicalgia: Secondary | ICD-10-CM | POA: Diagnosis not present

## 2017-09-02 DIAGNOSIS — M5136 Other intervertebral disc degeneration, lumbar region: Secondary | ICD-10-CM | POA: Diagnosis not present

## 2017-09-04 DIAGNOSIS — M9904 Segmental and somatic dysfunction of sacral region: Secondary | ICD-10-CM | POA: Diagnosis not present

## 2017-09-04 DIAGNOSIS — M542 Cervicalgia: Secondary | ICD-10-CM | POA: Diagnosis not present

## 2017-09-04 DIAGNOSIS — M9903 Segmental and somatic dysfunction of lumbar region: Secondary | ICD-10-CM | POA: Diagnosis not present

## 2017-09-04 DIAGNOSIS — M5136 Other intervertebral disc degeneration, lumbar region: Secondary | ICD-10-CM | POA: Diagnosis not present

## 2017-09-04 DIAGNOSIS — M9901 Segmental and somatic dysfunction of cervical region: Secondary | ICD-10-CM | POA: Diagnosis not present

## 2017-09-04 DIAGNOSIS — M545 Low back pain: Secondary | ICD-10-CM | POA: Diagnosis not present

## 2017-09-11 DIAGNOSIS — M9904 Segmental and somatic dysfunction of sacral region: Secondary | ICD-10-CM | POA: Diagnosis not present

## 2017-09-11 DIAGNOSIS — M545 Low back pain: Secondary | ICD-10-CM | POA: Diagnosis not present

## 2017-09-11 DIAGNOSIS — M9903 Segmental and somatic dysfunction of lumbar region: Secondary | ICD-10-CM | POA: Diagnosis not present

## 2017-09-11 DIAGNOSIS — M542 Cervicalgia: Secondary | ICD-10-CM | POA: Diagnosis not present

## 2017-09-11 DIAGNOSIS — M5136 Other intervertebral disc degeneration, lumbar region: Secondary | ICD-10-CM | POA: Diagnosis not present

## 2017-09-11 DIAGNOSIS — M9901 Segmental and somatic dysfunction of cervical region: Secondary | ICD-10-CM | POA: Diagnosis not present

## 2017-09-17 DIAGNOSIS — M9903 Segmental and somatic dysfunction of lumbar region: Secondary | ICD-10-CM | POA: Diagnosis not present

## 2017-09-17 DIAGNOSIS — M5136 Other intervertebral disc degeneration, lumbar region: Secondary | ICD-10-CM | POA: Diagnosis not present

## 2017-09-17 DIAGNOSIS — M545 Low back pain: Secondary | ICD-10-CM | POA: Diagnosis not present

## 2017-09-17 DIAGNOSIS — M542 Cervicalgia: Secondary | ICD-10-CM | POA: Diagnosis not present

## 2017-09-17 DIAGNOSIS — M9904 Segmental and somatic dysfunction of sacral region: Secondary | ICD-10-CM | POA: Diagnosis not present

## 2017-09-17 DIAGNOSIS — M9901 Segmental and somatic dysfunction of cervical region: Secondary | ICD-10-CM | POA: Diagnosis not present

## 2017-09-20 DIAGNOSIS — H2513 Age-related nuclear cataract, bilateral: Secondary | ICD-10-CM | POA: Diagnosis not present

## 2017-09-20 DIAGNOSIS — H40051 Ocular hypertension, right eye: Secondary | ICD-10-CM | POA: Diagnosis not present

## 2017-09-20 DIAGNOSIS — H524 Presbyopia: Secondary | ICD-10-CM | POA: Diagnosis not present

## 2017-09-20 DIAGNOSIS — H5203 Hypermetropia, bilateral: Secondary | ICD-10-CM | POA: Diagnosis not present

## 2017-09-25 DIAGNOSIS — M9904 Segmental and somatic dysfunction of sacral region: Secondary | ICD-10-CM | POA: Diagnosis not present

## 2017-09-25 DIAGNOSIS — M9901 Segmental and somatic dysfunction of cervical region: Secondary | ICD-10-CM | POA: Diagnosis not present

## 2017-09-25 DIAGNOSIS — M9903 Segmental and somatic dysfunction of lumbar region: Secondary | ICD-10-CM | POA: Diagnosis not present

## 2017-09-25 DIAGNOSIS — M5136 Other intervertebral disc degeneration, lumbar region: Secondary | ICD-10-CM | POA: Diagnosis not present

## 2017-09-25 DIAGNOSIS — M542 Cervicalgia: Secondary | ICD-10-CM | POA: Diagnosis not present

## 2017-09-25 DIAGNOSIS — M545 Low back pain: Secondary | ICD-10-CM | POA: Diagnosis not present

## 2017-10-17 DIAGNOSIS — M25551 Pain in right hip: Secondary | ICD-10-CM | POA: Diagnosis not present

## 2017-10-17 DIAGNOSIS — Z6831 Body mass index (BMI) 31.0-31.9, adult: Secondary | ICD-10-CM | POA: Diagnosis not present

## 2017-10-17 DIAGNOSIS — Z0000 Encounter for general adult medical examination without abnormal findings: Secondary | ICD-10-CM | POA: Diagnosis not present

## 2017-10-17 DIAGNOSIS — Z1389 Encounter for screening for other disorder: Secondary | ICD-10-CM | POA: Diagnosis not present

## 2017-10-17 DIAGNOSIS — E669 Obesity, unspecified: Secondary | ICD-10-CM | POA: Diagnosis not present

## 2017-10-17 DIAGNOSIS — E7800 Pure hypercholesterolemia, unspecified: Secondary | ICD-10-CM | POA: Diagnosis not present

## 2017-10-17 DIAGNOSIS — I10 Essential (primary) hypertension: Secondary | ICD-10-CM | POA: Diagnosis not present

## 2017-10-17 DIAGNOSIS — G4700 Insomnia, unspecified: Secondary | ICD-10-CM | POA: Diagnosis not present

## 2017-12-17 DIAGNOSIS — Z1231 Encounter for screening mammogram for malignant neoplasm of breast: Secondary | ICD-10-CM | POA: Diagnosis not present

## 2017-12-19 DIAGNOSIS — H1045 Other chronic allergic conjunctivitis: Secondary | ICD-10-CM | POA: Diagnosis not present

## 2018-01-16 DIAGNOSIS — Z23 Encounter for immunization: Secondary | ICD-10-CM | POA: Diagnosis not present

## 2018-05-13 DIAGNOSIS — C4492 Squamous cell carcinoma of skin, unspecified: Secondary | ICD-10-CM

## 2018-05-13 HISTORY — DX: Squamous cell carcinoma of skin, unspecified: C44.92

## 2018-05-15 DIAGNOSIS — E7800 Pure hypercholesterolemia, unspecified: Secondary | ICD-10-CM | POA: Diagnosis not present

## 2018-05-15 DIAGNOSIS — I10 Essential (primary) hypertension: Secondary | ICD-10-CM | POA: Diagnosis not present

## 2018-05-15 DIAGNOSIS — G4700 Insomnia, unspecified: Secondary | ICD-10-CM | POA: Diagnosis not present

## 2018-05-15 DIAGNOSIS — K219 Gastro-esophageal reflux disease without esophagitis: Secondary | ICD-10-CM | POA: Diagnosis not present

## 2018-05-23 ENCOUNTER — Other Ambulatory Visit: Payer: Self-pay | Admitting: Physician Assistant

## 2018-05-23 DIAGNOSIS — C44722 Squamous cell carcinoma of skin of right lower limb, including hip: Secondary | ICD-10-CM | POA: Diagnosis not present

## 2018-05-23 DIAGNOSIS — L820 Inflamed seborrheic keratosis: Secondary | ICD-10-CM | POA: Diagnosis not present

## 2018-07-14 DIAGNOSIS — E7800 Pure hypercholesterolemia, unspecified: Secondary | ICD-10-CM | POA: Diagnosis not present

## 2018-07-14 DIAGNOSIS — I10 Essential (primary) hypertension: Secondary | ICD-10-CM | POA: Diagnosis not present

## 2018-09-23 ENCOUNTER — Other Ambulatory Visit: Payer: Self-pay | Admitting: Physician Assistant

## 2018-09-23 DIAGNOSIS — L219 Seborrheic dermatitis, unspecified: Secondary | ICD-10-CM | POA: Diagnosis not present

## 2018-09-23 DIAGNOSIS — L570 Actinic keratosis: Secondary | ICD-10-CM | POA: Diagnosis not present

## 2018-09-23 DIAGNOSIS — D485 Neoplasm of uncertain behavior of skin: Secondary | ICD-10-CM | POA: Diagnosis not present

## 2018-09-23 DIAGNOSIS — L259 Unspecified contact dermatitis, unspecified cause: Secondary | ICD-10-CM | POA: Diagnosis not present

## 2018-10-03 DIAGNOSIS — H2513 Age-related nuclear cataract, bilateral: Secondary | ICD-10-CM | POA: Diagnosis not present

## 2018-10-20 DIAGNOSIS — H2589 Other age-related cataract: Secondary | ICD-10-CM | POA: Diagnosis not present

## 2018-10-20 DIAGNOSIS — H2513 Age-related nuclear cataract, bilateral: Secondary | ICD-10-CM | POA: Diagnosis not present

## 2018-10-20 DIAGNOSIS — H2511 Age-related nuclear cataract, right eye: Secondary | ICD-10-CM | POA: Diagnosis not present

## 2018-10-20 DIAGNOSIS — H25013 Cortical age-related cataract, bilateral: Secondary | ICD-10-CM | POA: Diagnosis not present

## 2018-10-20 DIAGNOSIS — H25043 Posterior subcapsular polar age-related cataract, bilateral: Secondary | ICD-10-CM | POA: Diagnosis not present

## 2018-11-11 DIAGNOSIS — H2511 Age-related nuclear cataract, right eye: Secondary | ICD-10-CM | POA: Diagnosis not present

## 2018-11-11 DIAGNOSIS — H2513 Age-related nuclear cataract, bilateral: Secondary | ICD-10-CM | POA: Diagnosis not present

## 2018-11-11 DIAGNOSIS — H25811 Combined forms of age-related cataract, right eye: Secondary | ICD-10-CM | POA: Diagnosis not present

## 2018-11-11 DIAGNOSIS — H25012 Cortical age-related cataract, left eye: Secondary | ICD-10-CM | POA: Diagnosis not present

## 2018-11-11 DIAGNOSIS — H25042 Posterior subcapsular polar age-related cataract, left eye: Secondary | ICD-10-CM | POA: Diagnosis not present

## 2018-11-11 DIAGNOSIS — H2512 Age-related nuclear cataract, left eye: Secondary | ICD-10-CM | POA: Diagnosis not present

## 2018-11-13 DIAGNOSIS — I10 Essential (primary) hypertension: Secondary | ICD-10-CM | POA: Diagnosis not present

## 2018-11-13 DIAGNOSIS — Z1389 Encounter for screening for other disorder: Secondary | ICD-10-CM | POA: Diagnosis not present

## 2018-11-13 DIAGNOSIS — Z0000 Encounter for general adult medical examination without abnormal findings: Secondary | ICD-10-CM | POA: Diagnosis not present

## 2018-11-13 DIAGNOSIS — G4700 Insomnia, unspecified: Secondary | ICD-10-CM | POA: Diagnosis not present

## 2018-11-13 DIAGNOSIS — E559 Vitamin D deficiency, unspecified: Secondary | ICD-10-CM | POA: Diagnosis not present

## 2018-11-13 DIAGNOSIS — E669 Obesity, unspecified: Secondary | ICD-10-CM | POA: Diagnosis not present

## 2018-11-13 DIAGNOSIS — F439 Reaction to severe stress, unspecified: Secondary | ICD-10-CM | POA: Diagnosis not present

## 2018-11-13 DIAGNOSIS — E7800 Pure hypercholesterolemia, unspecified: Secondary | ICD-10-CM | POA: Diagnosis not present

## 2018-11-18 DIAGNOSIS — H25812 Combined forms of age-related cataract, left eye: Secondary | ICD-10-CM | POA: Diagnosis not present

## 2018-11-18 DIAGNOSIS — H2512 Age-related nuclear cataract, left eye: Secondary | ICD-10-CM | POA: Diagnosis not present

## 2018-12-19 DIAGNOSIS — Z1231 Encounter for screening mammogram for malignant neoplasm of breast: Secondary | ICD-10-CM | POA: Diagnosis not present

## 2018-12-19 DIAGNOSIS — Z803 Family history of malignant neoplasm of breast: Secondary | ICD-10-CM | POA: Diagnosis not present

## 2019-01-07 DIAGNOSIS — H2513 Age-related nuclear cataract, bilateral: Secondary | ICD-10-CM | POA: Diagnosis not present

## 2019-02-09 DIAGNOSIS — Z23 Encounter for immunization: Secondary | ICD-10-CM | POA: Diagnosis not present

## 2019-06-04 ENCOUNTER — Ambulatory Visit: Payer: Medicare Other

## 2019-06-13 ENCOUNTER — Ambulatory Visit: Payer: Medicare Other

## 2019-06-24 ENCOUNTER — Ambulatory Visit (INDEPENDENT_AMBULATORY_CARE_PROVIDER_SITE_OTHER): Payer: Medicare Other | Admitting: Orthopedic Surgery

## 2019-06-24 ENCOUNTER — Other Ambulatory Visit: Payer: Self-pay

## 2019-06-24 ENCOUNTER — Ambulatory Visit (INDEPENDENT_AMBULATORY_CARE_PROVIDER_SITE_OTHER): Payer: Medicare Other

## 2019-06-24 DIAGNOSIS — M79641 Pain in right hand: Secondary | ICD-10-CM | POA: Diagnosis not present

## 2019-06-24 DIAGNOSIS — M7032 Other bursitis of elbow, left elbow: Secondary | ICD-10-CM

## 2019-06-25 ENCOUNTER — Encounter: Payer: Self-pay | Admitting: Orthopedic Surgery

## 2019-06-25 ENCOUNTER — Ambulatory Visit: Payer: Medicare Other

## 2019-06-25 NOTE — Progress Notes (Signed)
Office Visit Note   Patient: Donna Moreno           Date of Birth: Dec 15, 1945           MRN: [id number] Visit Date: 06/24/2019 Requested by: Carol Ada, [place],  [place] [address] PCP: Carol Ada, MD  Subjective: Chief Complaint  Patient presents with  . Right Hand - Pain  . Left Elbow - Pain    HPI: [place] is a patient with left elbow bursitis and right hand pain.  That left elbow has been bothering her for 3 weeks.  Cannot recall any injury but she has been doing some cleaning with her elbows on the floor.  She also has been doing some painting.  Denies any fevers or chills.  Tried ibuprofen which has not helped much.  She is getting her second toe which shot next week so she does not really want to do an injection.  Patient also reports right hand pain with pain at the base of the thumb.  That is been going on for several months as well.  Denies any history of injury.  Does report pain with pinching.              ROS: All systems reviewed are negative as they relate to the chief complaint within the history of present illness.  Patient denies  fevers or chills.   Assessment & Plan: Visit Diagnoses:  1. Bursitis of left elbow, unspecified bursa   2. Pain in right hand     Plan: Impression is primarily fluid-filled elbow olecranon bursa about the size of a golf ball.  I think aspiration is indicated with 3 days of compression.  We will do that as a scheduled injection in about 3 weeks.  Regarding the right hand she has [place] arthritis.  Injection also indicated for this joint to be done under ultrasound guidance.  Also has a scheduled injection.  See her back in 3 weeks for both those scheduled injections.  Continue with ibuprofen for now.  I do not think she is near surgery for that [facility] arthritis.  Follow-Up Instructions: No follow-ups on file.   Orders:  Orders Placed This Encounter  Procedures  . XR Elbow 2 Views Left  . XR Hand  Complete Right   No orders of the defined types were placed in this encounter.     Procedures: No procedures performed   Clinical Data: No additional findings.  Objective: Vital Signs: There were no vitals taken for this visit.  Physical Exam:   Constitutional: Patient appears well-developed HEENT:  Head: Normocephalic Eyes:EOM are normal Neck: Normal range of motion Cardiovascular: Normal rate Pulmonary/chest: Effort normal Neurologic: Patient is alert Skin: Skin is warm Psychiatric: Patient has normal mood and affect    Ortho Exam: Ortho exam demonstrates full active and passive range of motion of the elbow.  Does have fluid-filled olecranon bursitis without warmth erythema induration or proximal lymphadenopathy.  Elbow range of motion is full.  No masses lymphadenopathy or skin changes noted in that left elbow region.  Motor sensory function to the hand is intact.  Right hand is examined.  Wrist range of motion is full flexion extension ulnar and radial deviation with positive CMC grind test and intact EPL FPL function.  Radial pulses intact.  Specialty Comments:  No specialty comments available.  Imaging: XR Elbow 2 Views Left  Result Date: 06/25/2019 AP lateral left elbow reviewed.  No acute fracture  present.  No significant degenerative changes.  Some soft tissue swelling present at the tip of the olecranon  XR Hand Complete Right  Result Date: 06/24/2019 AP lateral oblique right hand reviewed.  No acute fractures present.  Mild to moderate degenerative changes noted at the base of the first metacarpal at the [facility] joint.  Moderate degenerative changes also noted diffusely throughout the DIP and PIP joints.  No acute fracture.    PMFS History: There are no problems to display for this patient.  History reviewed. No pertinent past medical history.  History reviewed. No pertinent family history.  History reviewed. No pertinent surgical history. Social History    Occupational History  . Not on file  Tobacco Use  . Smoking status: Former Research scientist (life sciences)  . Smokeless tobacco: Never Used  Substance and Sexual Activity  . Alcohol use: Not on file  . Drug use: Not on file  . Sexual activity: Not on file

## 2019-07-15 ENCOUNTER — Ambulatory Visit: Payer: Medicare Other | Admitting: Orthopedic Surgery

## 2019-07-20 ENCOUNTER — Other Ambulatory Visit: Payer: Self-pay | Admitting: Family Medicine

## 2019-07-20 ENCOUNTER — Other Ambulatory Visit: Payer: Self-pay

## 2019-07-20 ENCOUNTER — Ambulatory Visit
Admission: RE | Admit: 2019-07-20 | Discharge: 2019-07-20 | Disposition: A | Discharge status: Home / Self Care | Payer: Medicare Other | Source: Ambulatory Visit | Attending: Family Medicine | Admitting: Family Medicine

## 2019-07-20 DIAGNOSIS — I10 Essential (primary) hypertension: Secondary | ICD-10-CM | POA: Diagnosis not present

## 2019-07-20 DIAGNOSIS — E559 Vitamin D deficiency, unspecified: Secondary | ICD-10-CM | POA: Diagnosis not present

## 2019-07-20 DIAGNOSIS — R059 Cough, unspecified: Secondary | ICD-10-CM

## 2019-07-20 DIAGNOSIS — M545 Low back pain: Secondary | ICD-10-CM | POA: Diagnosis not present

## 2019-07-20 DIAGNOSIS — M9903 Segmental and somatic dysfunction of lumbar region: Secondary | ICD-10-CM | POA: Diagnosis not present

## 2019-07-20 DIAGNOSIS — M542 Cervicalgia: Secondary | ICD-10-CM | POA: Diagnosis not present

## 2019-07-20 DIAGNOSIS — R05 Cough: Secondary | ICD-10-CM

## 2019-07-20 DIAGNOSIS — M549 Dorsalgia, unspecified: Secondary | ICD-10-CM | POA: Diagnosis not present

## 2019-07-20 DIAGNOSIS — M5136 Other intervertebral disc degeneration, lumbar region: Secondary | ICD-10-CM | POA: Diagnosis not present

## 2019-07-20 DIAGNOSIS — G4700 Insomnia, unspecified: Secondary | ICD-10-CM | POA: Diagnosis not present

## 2019-07-20 DIAGNOSIS — M9901 Segmental and somatic dysfunction of cervical region: Secondary | ICD-10-CM | POA: Diagnosis not present

## 2019-07-20 DIAGNOSIS — M9904 Segmental and somatic dysfunction of sacral region: Secondary | ICD-10-CM | POA: Diagnosis not present

## 2019-07-20 DIAGNOSIS — E7800 Pure hypercholesterolemia, unspecified: Secondary | ICD-10-CM | POA: Diagnosis not present

## 2019-07-20 IMAGING — CR DG CHEST 2V
2 series · 2 of 2 positions shown · non-contrast
Comparison: None.

CLINICAL DATA: Cough.  Chest pain.  Recent fall 2 weeks ago.

EXAM:
CHEST - 2 VIEW

[w chest pa]
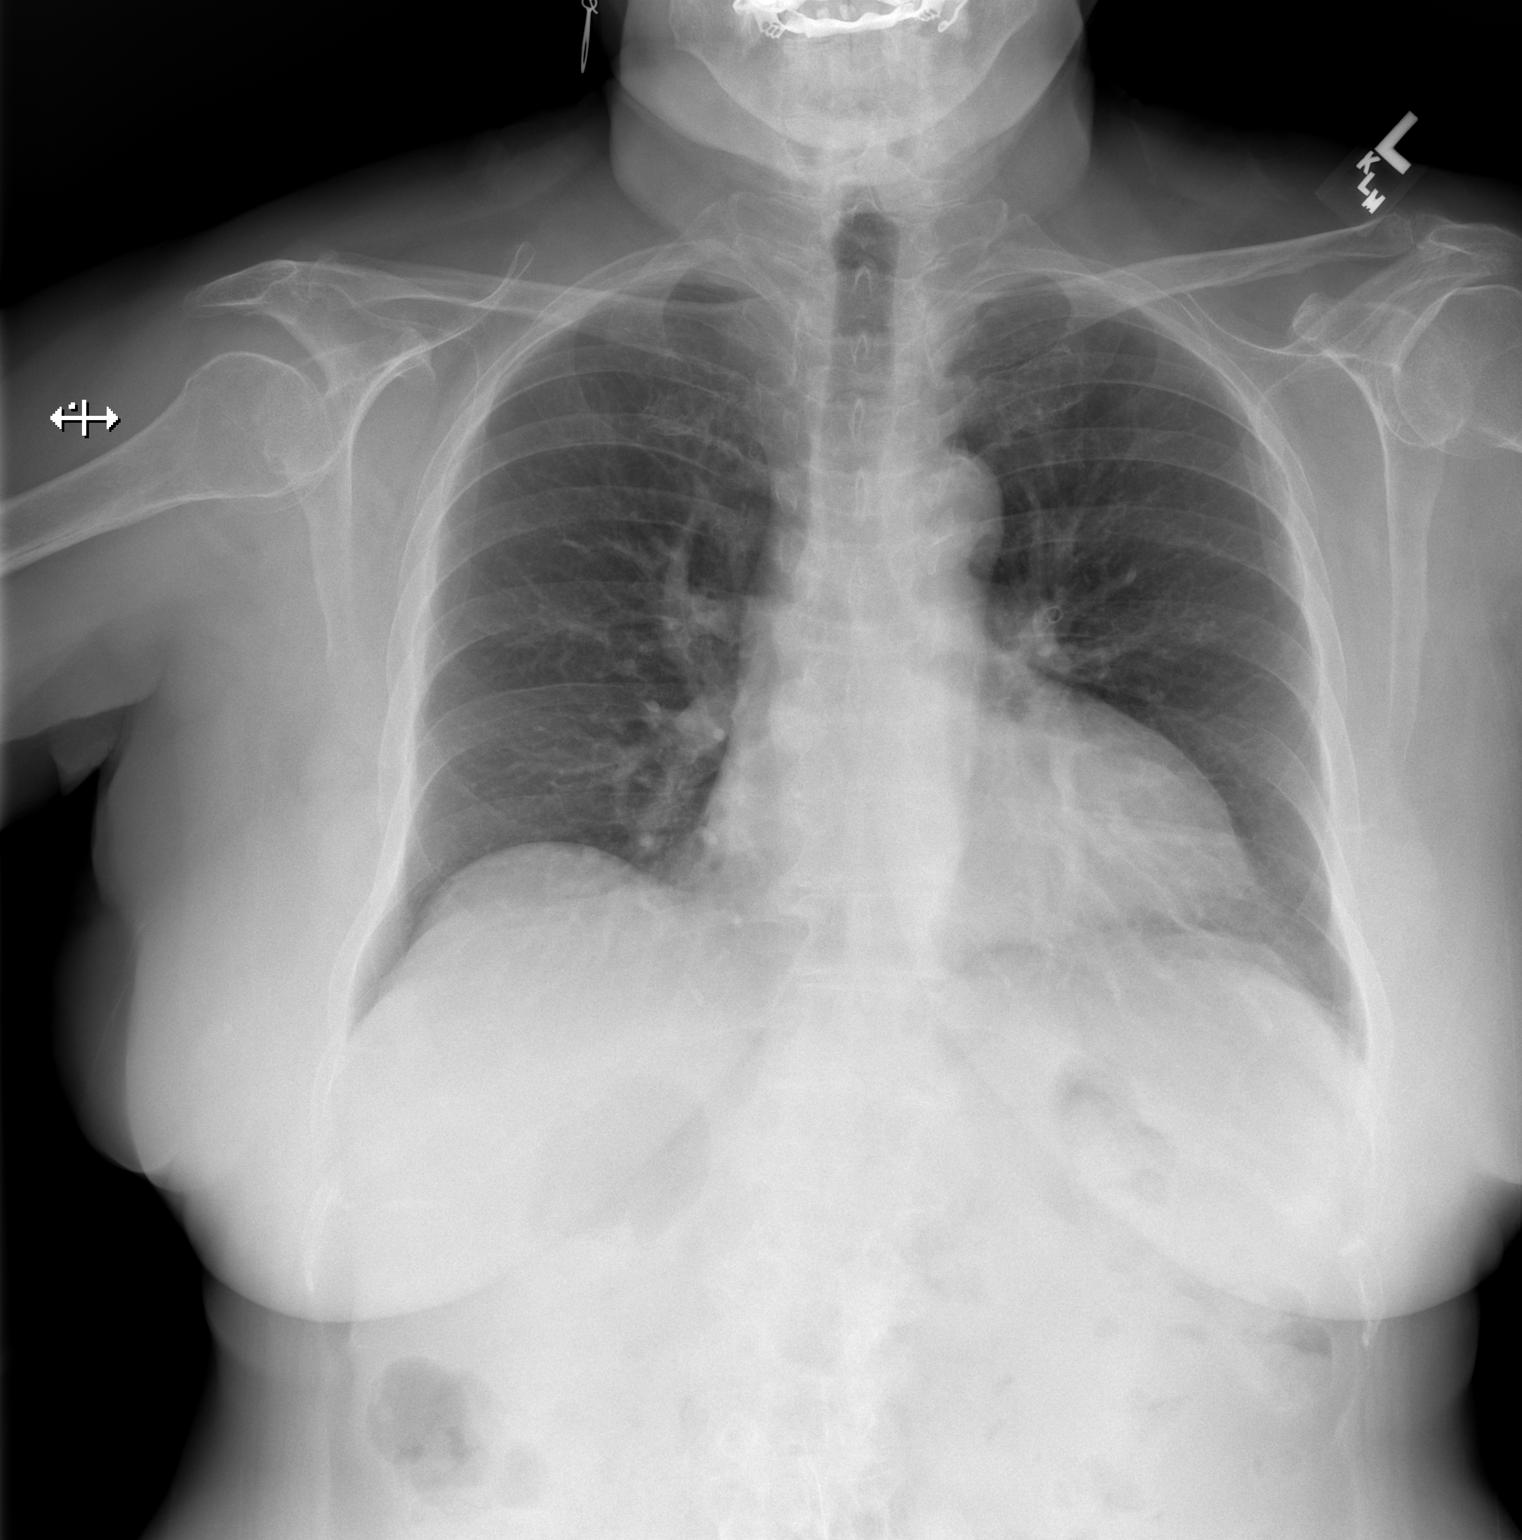

[w chest lat]
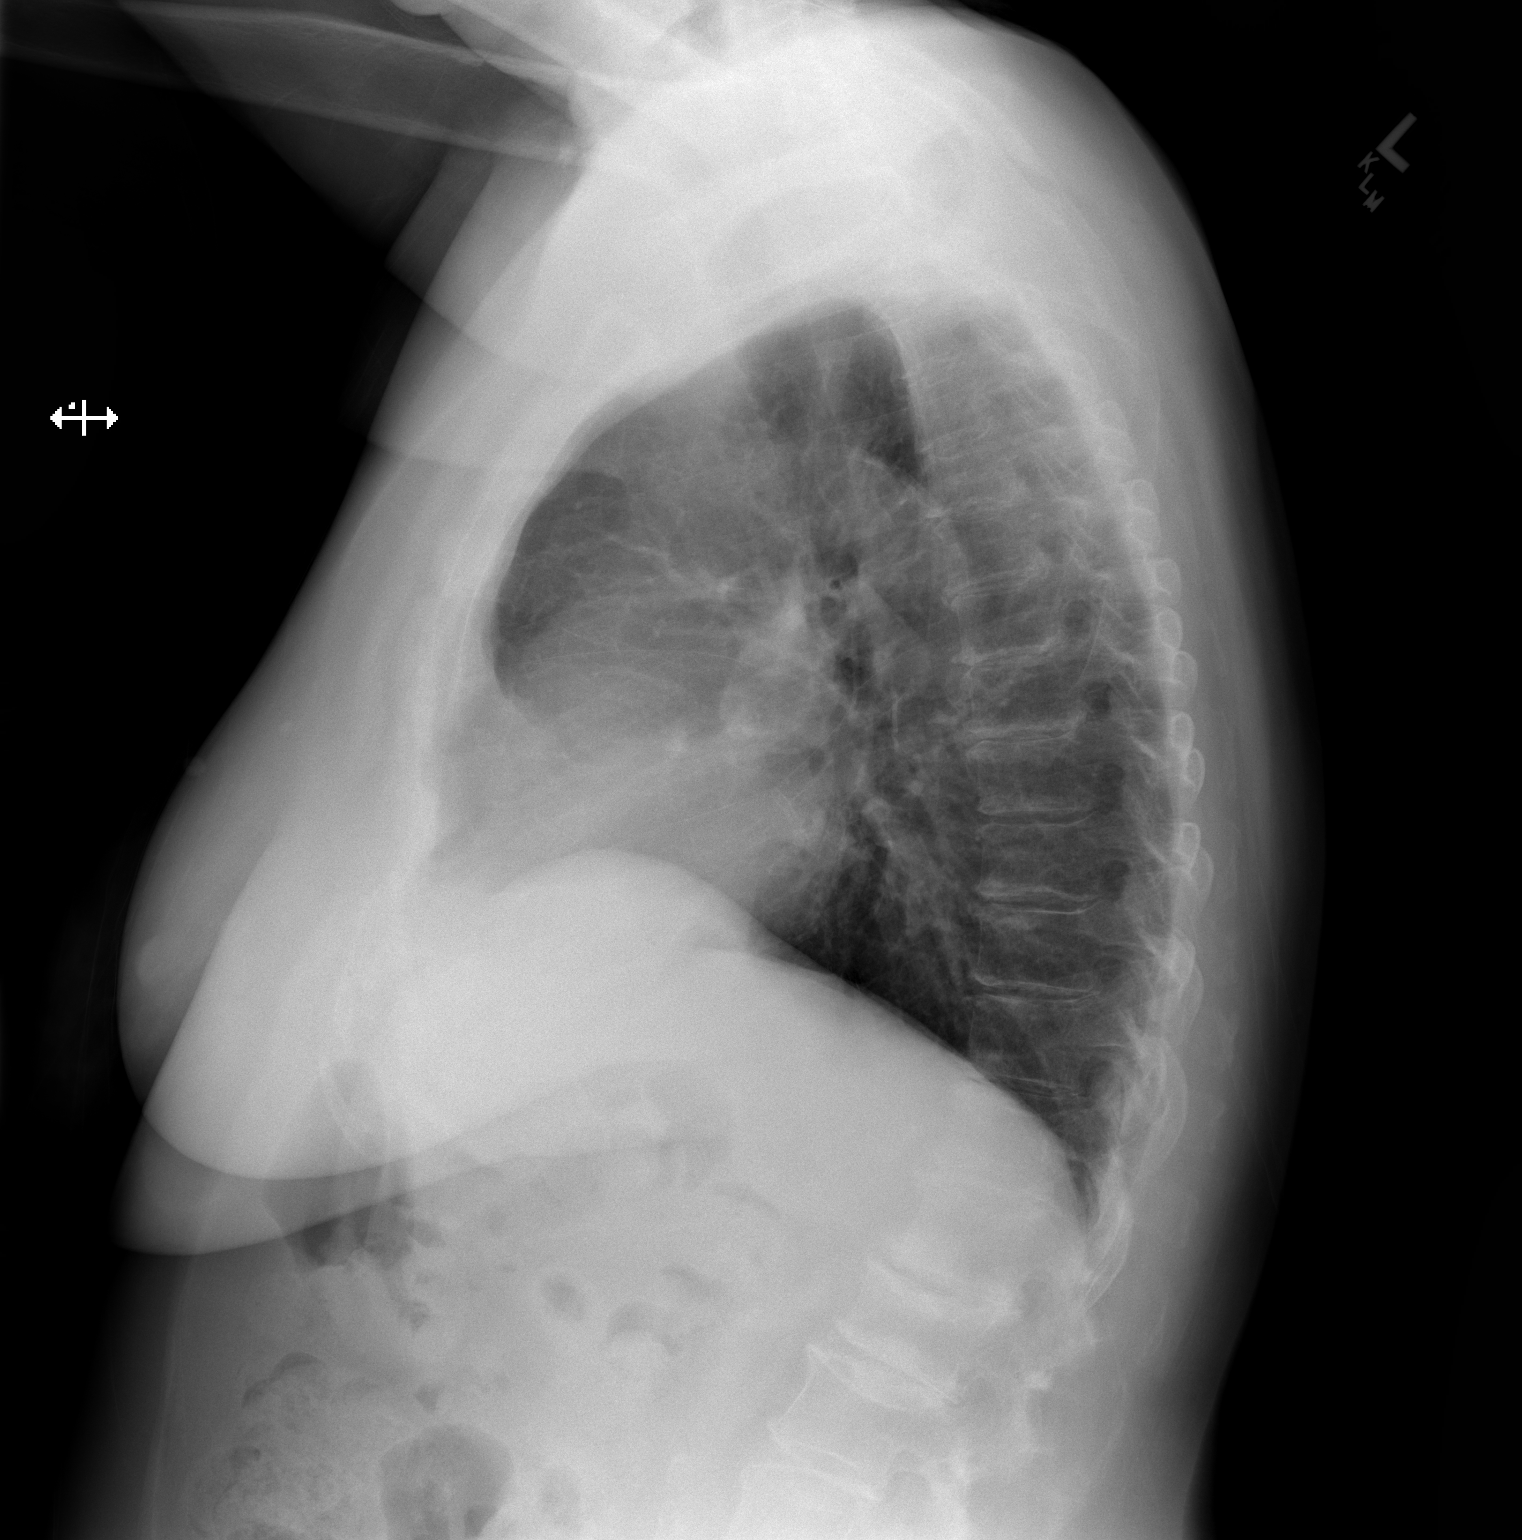

[2 of 2 positions shown; findings below may reference images not displayed]

FINDINGS: The heart size and mediastinal contours are within normal limits.
Both lungs are clear. The visualized skeletal structures are
unremarkable.
IMPRESSION: No active cardiopulmonary disease.

## 2019-07-22 DIAGNOSIS — M9901 Segmental and somatic dysfunction of cervical region: Secondary | ICD-10-CM | POA: Diagnosis not present

## 2019-07-22 DIAGNOSIS — M542 Cervicalgia: Secondary | ICD-10-CM | POA: Diagnosis not present

## 2019-07-22 DIAGNOSIS — M5136 Other intervertebral disc degeneration, lumbar region: Secondary | ICD-10-CM | POA: Diagnosis not present

## 2019-07-22 DIAGNOSIS — M545 Low back pain: Secondary | ICD-10-CM | POA: Diagnosis not present

## 2019-07-22 DIAGNOSIS — M9903 Segmental and somatic dysfunction of lumbar region: Secondary | ICD-10-CM | POA: Diagnosis not present

## 2019-07-22 DIAGNOSIS — M9904 Segmental and somatic dysfunction of sacral region: Secondary | ICD-10-CM | POA: Diagnosis not present

## 2019-07-27 DIAGNOSIS — M9904 Segmental and somatic dysfunction of sacral region: Secondary | ICD-10-CM | POA: Diagnosis not present

## 2019-07-27 DIAGNOSIS — M545 Low back pain: Secondary | ICD-10-CM | POA: Diagnosis not present

## 2019-07-27 DIAGNOSIS — M9901 Segmental and somatic dysfunction of cervical region: Secondary | ICD-10-CM | POA: Diagnosis not present

## 2019-07-27 DIAGNOSIS — M9903 Segmental and somatic dysfunction of lumbar region: Secondary | ICD-10-CM | POA: Diagnosis not present

## 2019-07-27 DIAGNOSIS — M542 Cervicalgia: Secondary | ICD-10-CM | POA: Diagnosis not present

## 2019-07-27 DIAGNOSIS — M5136 Other intervertebral disc degeneration, lumbar region: Secondary | ICD-10-CM | POA: Diagnosis not present

## 2019-07-28 DIAGNOSIS — M545 Low back pain: Secondary | ICD-10-CM | POA: Diagnosis not present

## 2019-07-28 DIAGNOSIS — M9904 Segmental and somatic dysfunction of sacral region: Secondary | ICD-10-CM | POA: Diagnosis not present

## 2019-07-28 DIAGNOSIS — M9901 Segmental and somatic dysfunction of cervical region: Secondary | ICD-10-CM | POA: Diagnosis not present

## 2019-07-28 DIAGNOSIS — M5136 Other intervertebral disc degeneration, lumbar region: Secondary | ICD-10-CM | POA: Diagnosis not present

## 2019-07-28 DIAGNOSIS — M9903 Segmental and somatic dysfunction of lumbar region: Secondary | ICD-10-CM | POA: Diagnosis not present

## 2019-07-28 DIAGNOSIS — M542 Cervicalgia: Secondary | ICD-10-CM | POA: Diagnosis not present

## 2019-10-01 DIAGNOSIS — L900 Lichen sclerosus et atrophicus: Secondary | ICD-10-CM | POA: Diagnosis not present

## 2019-10-01 DIAGNOSIS — R3989 Other symptoms and signs involving the genitourinary system: Secondary | ICD-10-CM | POA: Diagnosis not present

## 2019-12-16 ENCOUNTER — Ambulatory Visit (INDEPENDENT_AMBULATORY_CARE_PROVIDER_SITE_OTHER): Payer: Medicare Other | Admitting: Orthopedic Surgery

## 2019-12-16 DIAGNOSIS — M1811 Unilateral primary osteoarthritis of first carpometacarpal joint, right hand: Secondary | ICD-10-CM

## 2019-12-17 ENCOUNTER — Encounter: Payer: Self-pay | Admitting: Orthopedic Surgery

## 2019-12-17 ENCOUNTER — Telehealth: Payer: Self-pay | Admitting: Orthopedic Surgery

## 2019-12-17 DIAGNOSIS — M1811 Unilateral primary osteoarthritis of first carpometacarpal joint, right hand: Secondary | ICD-10-CM | POA: Diagnosis not present

## 2019-12-17 MED ORDER — BUPIVACAINE HCL 0.25 % IJ SOLN
0.3300 mL | INTRAMUSCULAR | Status: AC | PRN
  Administered 2019-12-17: .33 mL via INTRA_ARTICULAR

## 2019-12-17 MED ORDER — LIDOCAINE HCL 1 % IJ SOLN
3.0000 mL | INTRAMUSCULAR | Status: AC | PRN
  Administered 2019-12-17: 3 mL

## 2019-12-17 MED ORDER — METHYLPREDNISOLONE ACETATE 40 MG/ML IJ SUSP
13.3300 mg | INTRAMUSCULAR | Status: AC | PRN
  Administered 2019-12-17: 13.33 mg via INTRA_ARTICULAR

## 2019-12-17 NOTE — Telephone Encounter (Signed)
IC s/w patient. 

## 2019-12-17 NOTE — Progress Notes (Signed)
Office Visit Note   Patient: Donna Moreno           Date of Birth: 03-Aug-1945           MRN: [id number] Visit Date: 12/16/2019 Requested by: Carol Ada, MD [place],  [place] [address] PCP: Carol Ada, MD  Subjective: Chief Complaint  Patient presents with  . Right Wrist - Pain    HPI: Donna Moreno is a 74 year old patient with CMC pain.  She would like to have it injected.  She was considering injection earlier this year but was in the middle of getting her vaccine.  Now she presents with persistent pain in that thumb region.  Does radiate to the dorsal aspect of her hand as well.  Denies any neck pain.  Does report pain with pinching and gripping.  Radiographs do demonstrate CMC arthritis in the right thumb.              ROS: All systems reviewed are negative as they relate to the chief complaint within the history of present illness.  Patient denies  fevers or chills.   Assessment & Plan: Visit Diagnoses:  1. Arthritis of carpometacarpal (CMC) joint of right thumb     Plan: Impression is symptomatic right thumb CMC arthritis.  Plan is ultrasound-guided injection today.  Risk benefits are discussed.  Topical anti-inflammatory and activity modification discussed.  We will see how she does with this injection.  Follow-up as needed.  Follow-Up Instructions: Return if symptoms worsen or fail to improve.   Orders:  No orders of the defined types were placed in this encounter.  No orders of the defined types were placed in this encounter.     Procedures: Small Joint Inj: R thumb CMC on 12/17/2019 7:01 AM Indications: pain Details: 25 G needle, ultrasound-guided radial approach  Spinal Needle: No  Medications: 3 mL lidocaine 1 %; 0.33 mL bupivacaine 0.25 %; 13.33 mg methylPREDNISolone acetate 40 MG/ML Outcome: tolerated well, no immediate complications Procedure, treatment alternatives, risks and benefits explained, specific risks discussed.  Consent was given by the patient. Immediately prior to procedure a time out was called to verify the correct patient, procedure, equipment, support staff and site/side marked as required. Patient was prepped and draped in the usual sterile fashion.       Clinical Data: No additional findings.  Objective: Vital Signs: There were no vitals taken for this visit.  Physical Exam:   Constitutional: Patient appears well-developed HEENT:  Head: Normocephalic Eyes:EOM are normal Neck: Normal range of motion Cardiovascular: Normal rate Pulmonary/chest: Effort normal Neurologic: Patient is alert Skin: Skin is warm Psychiatric: Patient has normal mood and affect    Ortho Exam: Ortho exam demonstrates good grip strength bilaterally.  She does have tenderness to palpation at the [facility] joint on the right less on the left.  Positive grind test.  Does have pain with pinching on the right compared to the left.  EPL FPL interosseous strength is intact.  Wrist range of motion intact with no scaphoid tenderness and no first dorsal compartment tenderness.  Specialty Comments:  No specialty comments available.  Imaging: No results found.   PMFS History: There are no problems to display for this patient.  History reviewed. No pertinent past medical history.  History reviewed. No pertinent family history.  History reviewed. No pertinent surgical history. Social History   Occupational History  . Not on file  Tobacco Use  . Smoking status: Former Research scientist (life sciences)  .  Smokeless tobacco: Never Used  Substance and Sexual Activity  . Alcohol use: Not on file  . Drug use: Not on file  . Sexual activity: Not on file

## 2019-12-17 NOTE — Telephone Encounter (Signed)
Pt would like a CB in regards to a personal matter discussed at her appt on 12/16/19  [phone]

## 2019-12-29 DIAGNOSIS — Z1231 Encounter for screening mammogram for malignant neoplasm of breast: Secondary | ICD-10-CM | POA: Diagnosis not present

## 2020-01-14 ENCOUNTER — Encounter: Payer: Self-pay | Admitting: Physician Assistant

## 2020-01-14 ENCOUNTER — Ambulatory Visit (INDEPENDENT_AMBULATORY_CARE_PROVIDER_SITE_OTHER): Payer: Medicare Other | Admitting: Physician Assistant

## 2020-01-14 ENCOUNTER — Other Ambulatory Visit: Payer: Self-pay

## 2020-01-14 DIAGNOSIS — B079 Viral wart, unspecified: Secondary | ICD-10-CM | POA: Diagnosis not present

## 2020-01-14 DIAGNOSIS — L820 Inflamed seborrheic keratosis: Secondary | ICD-10-CM | POA: Diagnosis not present

## 2020-01-14 DIAGNOSIS — Z8589 Personal history of malignant neoplasm of other organs and systems: Secondary | ICD-10-CM

## 2020-01-14 DIAGNOSIS — L918 Other hypertrophic disorders of the skin: Secondary | ICD-10-CM | POA: Diagnosis not present

## 2020-01-14 DIAGNOSIS — D485 Neoplasm of uncertain behavior of skin: Secondary | ICD-10-CM

## 2020-01-14 NOTE — Progress Notes (Signed)
   Follow up Visit  Subjective  Donna Moreno is a 74 y.o. female who presents for the following: Skin Problem (spot on chest(noticed a couple weeks ago)--spot right leg (noticed 1 week)--skin tag near pelvic area). 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 Spot on the chest that is raised. Not sore and doesn't bleed.   Objective  Well appearing patient in no apparent distress; mood and affect are within normal limits.  A focused examination was performed including arms and legs. Relevant physical exam findings are noted in the Assessment and Plan. Face, chest, back examined. Relevant physical exam findings are noted in the Assessment and Plan.   Objective  Right Popliteal Fossa: Verrucous papules -- Discussed viral etiology and contagion.   Objective  Left Medial Thigh: Fleshy, skin-colored sessile and pedunculated papules.    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 No charge.  Neoplasm of uncertain behavior of skin Chest - Medial (Center)  Skin / nail biopsy Type of biopsy: tangential   Informed consent: discussed and consent obtained   Timeout: patient name, date of birth, surgical site, and procedure verified   Procedure prep:  Patient was prepped and draped in usual sterile fashion (Non sterile) Prep type:  Chlorhexidine Anesthesia: the lesion was anesthetized  in a standard fashion   Anesthetic:  1% lidocaine w/ epinephrine 1-100,000 local infiltration Instrument used: flexible razor blade   Outcome: patient tolerated procedure well   Post-procedure details: wound care instructions given    Specimen 1 - Surgical pathology Differential Diagnosis: scc vs bcc Check Margins: No  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

## 2020-01-14 NOTE — Patient Instructions (Signed)

## 2020-03-06 DIAGNOSIS — Z23 Encounter for immunization: Secondary | ICD-10-CM | POA: Diagnosis not present

## 2020-05-24 ENCOUNTER — Ambulatory Visit: Payer: Medicare Other | Admitting: Physician Assistant

## 2020-06-07 DIAGNOSIS — M9904 Segmental and somatic dysfunction of sacral region: Secondary | ICD-10-CM | POA: Diagnosis not present

## 2020-06-07 DIAGNOSIS — M5136 Other intervertebral disc degeneration, lumbar region: Secondary | ICD-10-CM | POA: Diagnosis not present

## 2020-06-07 DIAGNOSIS — M542 Cervicalgia: Secondary | ICD-10-CM | POA: Diagnosis not present

## 2020-06-07 DIAGNOSIS — M9903 Segmental and somatic dysfunction of lumbar region: Secondary | ICD-10-CM | POA: Diagnosis not present

## 2020-06-07 DIAGNOSIS — M9901 Segmental and somatic dysfunction of cervical region: Secondary | ICD-10-CM | POA: Diagnosis not present

## 2020-06-08 DIAGNOSIS — M542 Cervicalgia: Secondary | ICD-10-CM | POA: Diagnosis not present

## 2020-06-08 DIAGNOSIS — M9904 Segmental and somatic dysfunction of sacral region: Secondary | ICD-10-CM | POA: Diagnosis not present

## 2020-06-08 DIAGNOSIS — M5136 Other intervertebral disc degeneration, lumbar region: Secondary | ICD-10-CM | POA: Diagnosis not present

## 2020-06-08 DIAGNOSIS — M9901 Segmental and somatic dysfunction of cervical region: Secondary | ICD-10-CM | POA: Diagnosis not present

## 2020-06-08 DIAGNOSIS — M9903 Segmental and somatic dysfunction of lumbar region: Secondary | ICD-10-CM | POA: Diagnosis not present

## 2020-06-09 DIAGNOSIS — M5136 Other intervertebral disc degeneration, lumbar region: Secondary | ICD-10-CM | POA: Diagnosis not present

## 2020-06-09 DIAGNOSIS — M9903 Segmental and somatic dysfunction of lumbar region: Secondary | ICD-10-CM | POA: Diagnosis not present

## 2020-06-09 DIAGNOSIS — M9901 Segmental and somatic dysfunction of cervical region: Secondary | ICD-10-CM | POA: Diagnosis not present

## 2020-06-09 DIAGNOSIS — M542 Cervicalgia: Secondary | ICD-10-CM | POA: Diagnosis not present

## 2020-06-09 DIAGNOSIS — M9904 Segmental and somatic dysfunction of sacral region: Secondary | ICD-10-CM | POA: Diagnosis not present

## 2020-06-20 DIAGNOSIS — M542 Cervicalgia: Secondary | ICD-10-CM | POA: Diagnosis not present

## 2020-06-20 DIAGNOSIS — M9904 Segmental and somatic dysfunction of sacral region: Secondary | ICD-10-CM | POA: Diagnosis not present

## 2020-06-20 DIAGNOSIS — M9903 Segmental and somatic dysfunction of lumbar region: Secondary | ICD-10-CM | POA: Diagnosis not present

## 2020-06-20 DIAGNOSIS — M5136 Other intervertebral disc degeneration, lumbar region: Secondary | ICD-10-CM | POA: Diagnosis not present

## 2020-06-20 DIAGNOSIS — M9901 Segmental and somatic dysfunction of cervical region: Secondary | ICD-10-CM | POA: Diagnosis not present

## 2020-06-22 DIAGNOSIS — M9903 Segmental and somatic dysfunction of lumbar region: Secondary | ICD-10-CM | POA: Diagnosis not present

## 2020-06-22 DIAGNOSIS — M9904 Segmental and somatic dysfunction of sacral region: Secondary | ICD-10-CM | POA: Diagnosis not present

## 2020-06-22 DIAGNOSIS — M5136 Other intervertebral disc degeneration, lumbar region: Secondary | ICD-10-CM | POA: Diagnosis not present

## 2020-06-22 DIAGNOSIS — M542 Cervicalgia: Secondary | ICD-10-CM | POA: Diagnosis not present

## 2020-06-22 DIAGNOSIS — M9901 Segmental and somatic dysfunction of cervical region: Secondary | ICD-10-CM | POA: Diagnosis not present

## 2020-06-27 DIAGNOSIS — M9903 Segmental and somatic dysfunction of lumbar region: Secondary | ICD-10-CM | POA: Diagnosis not present

## 2020-06-27 DIAGNOSIS — M5136 Other intervertebral disc degeneration, lumbar region: Secondary | ICD-10-CM | POA: Diagnosis not present

## 2020-06-27 DIAGNOSIS — M542 Cervicalgia: Secondary | ICD-10-CM | POA: Diagnosis not present

## 2020-06-27 DIAGNOSIS — M9901 Segmental and somatic dysfunction of cervical region: Secondary | ICD-10-CM | POA: Diagnosis not present

## 2020-06-27 DIAGNOSIS — M9904 Segmental and somatic dysfunction of sacral region: Secondary | ICD-10-CM | POA: Diagnosis not present

## 2020-07-04 DIAGNOSIS — M9904 Segmental and somatic dysfunction of sacral region: Secondary | ICD-10-CM | POA: Diagnosis not present

## 2020-07-04 DIAGNOSIS — M9901 Segmental and somatic dysfunction of cervical region: Secondary | ICD-10-CM | POA: Diagnosis not present

## 2020-07-04 DIAGNOSIS — M9903 Segmental and somatic dysfunction of lumbar region: Secondary | ICD-10-CM | POA: Diagnosis not present

## 2020-07-04 DIAGNOSIS — M542 Cervicalgia: Secondary | ICD-10-CM | POA: Diagnosis not present

## 2020-07-04 DIAGNOSIS — M5136 Other intervertebral disc degeneration, lumbar region: Secondary | ICD-10-CM | POA: Diagnosis not present

## 2020-07-05 DIAGNOSIS — E7800 Pure hypercholesterolemia, unspecified: Secondary | ICD-10-CM | POA: Diagnosis not present

## 2020-07-05 DIAGNOSIS — M791 Myalgia, unspecified site: Secondary | ICD-10-CM | POA: Diagnosis not present

## 2020-07-05 DIAGNOSIS — I10 Essential (primary) hypertension: Secondary | ICD-10-CM | POA: Diagnosis not present

## 2020-07-05 DIAGNOSIS — G4700 Insomnia, unspecified: Secondary | ICD-10-CM | POA: Diagnosis not present

## 2020-07-06 DIAGNOSIS — M9903 Segmental and somatic dysfunction of lumbar region: Secondary | ICD-10-CM | POA: Diagnosis not present

## 2020-07-06 DIAGNOSIS — M9904 Segmental and somatic dysfunction of sacral region: Secondary | ICD-10-CM | POA: Diagnosis not present

## 2020-07-06 DIAGNOSIS — M9901 Segmental and somatic dysfunction of cervical region: Secondary | ICD-10-CM | POA: Diagnosis not present

## 2020-07-06 DIAGNOSIS — M542 Cervicalgia: Secondary | ICD-10-CM | POA: Diagnosis not present

## 2020-07-06 DIAGNOSIS — M5136 Other intervertebral disc degeneration, lumbar region: Secondary | ICD-10-CM | POA: Diagnosis not present

## 2020-07-13 DIAGNOSIS — M9903 Segmental and somatic dysfunction of lumbar region: Secondary | ICD-10-CM | POA: Diagnosis not present

## 2020-07-13 DIAGNOSIS — M5136 Other intervertebral disc degeneration, lumbar region: Secondary | ICD-10-CM | POA: Diagnosis not present

## 2020-07-13 DIAGNOSIS — M542 Cervicalgia: Secondary | ICD-10-CM | POA: Diagnosis not present

## 2020-07-13 DIAGNOSIS — M9904 Segmental and somatic dysfunction of sacral region: Secondary | ICD-10-CM | POA: Diagnosis not present

## 2020-07-13 DIAGNOSIS — M9901 Segmental and somatic dysfunction of cervical region: Secondary | ICD-10-CM | POA: Diagnosis not present

## 2020-07-14 DIAGNOSIS — M9901 Segmental and somatic dysfunction of cervical region: Secondary | ICD-10-CM | POA: Diagnosis not present

## 2020-07-14 DIAGNOSIS — M9903 Segmental and somatic dysfunction of lumbar region: Secondary | ICD-10-CM | POA: Diagnosis not present

## 2020-07-14 DIAGNOSIS — M5136 Other intervertebral disc degeneration, lumbar region: Secondary | ICD-10-CM | POA: Diagnosis not present

## 2020-07-14 DIAGNOSIS — M542 Cervicalgia: Secondary | ICD-10-CM | POA: Diagnosis not present

## 2020-07-14 DIAGNOSIS — M9904 Segmental and somatic dysfunction of sacral region: Secondary | ICD-10-CM | POA: Diagnosis not present

## 2020-07-20 DIAGNOSIS — M542 Cervicalgia: Secondary | ICD-10-CM | POA: Diagnosis not present

## 2020-07-20 DIAGNOSIS — M9904 Segmental and somatic dysfunction of sacral region: Secondary | ICD-10-CM | POA: Diagnosis not present

## 2020-07-20 DIAGNOSIS — M9903 Segmental and somatic dysfunction of lumbar region: Secondary | ICD-10-CM | POA: Diagnosis not present

## 2020-07-20 DIAGNOSIS — M9901 Segmental and somatic dysfunction of cervical region: Secondary | ICD-10-CM | POA: Diagnosis not present

## 2020-07-20 DIAGNOSIS — M5136 Other intervertebral disc degeneration, lumbar region: Secondary | ICD-10-CM | POA: Diagnosis not present

## 2020-07-27 DIAGNOSIS — M5136 Other intervertebral disc degeneration, lumbar region: Secondary | ICD-10-CM | POA: Diagnosis not present

## 2020-07-27 DIAGNOSIS — M542 Cervicalgia: Secondary | ICD-10-CM | POA: Diagnosis not present

## 2020-07-27 DIAGNOSIS — M9904 Segmental and somatic dysfunction of sacral region: Secondary | ICD-10-CM | POA: Diagnosis not present

## 2020-07-27 DIAGNOSIS — M9903 Segmental and somatic dysfunction of lumbar region: Secondary | ICD-10-CM | POA: Diagnosis not present

## 2020-07-27 DIAGNOSIS — M9901 Segmental and somatic dysfunction of cervical region: Secondary | ICD-10-CM | POA: Diagnosis not present

## 2020-08-10 ENCOUNTER — Other Ambulatory Visit: Payer: Self-pay

## 2020-08-10 ENCOUNTER — Encounter: Payer: Self-pay | Admitting: Physician Assistant

## 2020-08-10 ENCOUNTER — Ambulatory Visit (INDEPENDENT_AMBULATORY_CARE_PROVIDER_SITE_OTHER): Payer: Medicare Other | Admitting: Physician Assistant

## 2020-08-10 DIAGNOSIS — Z1283 Encounter for screening for malignant neoplasm of skin: Secondary | ICD-10-CM

## 2020-08-10 DIAGNOSIS — L821 Other seborrheic keratosis: Secondary | ICD-10-CM

## 2020-08-10 DIAGNOSIS — L820 Inflamed seborrheic keratosis: Secondary | ICD-10-CM

## 2020-08-10 DIAGNOSIS — L578 Other skin changes due to chronic exposure to nonionizing radiation: Secondary | ICD-10-CM | POA: Diagnosis not present

## 2020-08-10 DIAGNOSIS — D1800 Hemangioma unspecified site: Secondary | ICD-10-CM | POA: Diagnosis not present

## 2020-08-10 DIAGNOSIS — D229 Melanocytic nevi, unspecified: Secondary | ICD-10-CM

## 2020-08-10 DIAGNOSIS — D485 Neoplasm of uncertain behavior of skin: Secondary | ICD-10-CM | POA: Diagnosis not present

## 2020-08-10 DIAGNOSIS — L814 Other melanin hyperpigmentation: Secondary | ICD-10-CM | POA: Diagnosis not present

## 2020-08-10 DIAGNOSIS — L570 Actinic keratosis: Secondary | ICD-10-CM | POA: Diagnosis not present

## 2020-08-10 NOTE — Patient Instructions (Signed)

## 2020-08-24 ENCOUNTER — Encounter: Payer: Self-pay | Admitting: Physician Assistant

## 2020-08-24 NOTE — Progress Notes (Signed)
Follow-Up Visit   Subjective  Donna Moreno is a 75 y.o. female who presents for the following: Annual Exam (Full body skin examination- new lesion on right upper arm x 1 month- no bleed but does itch sometimes.  Personal history of non mole skin cancer. No family history of melanoma or non mole skin cancers. ).   The following portions of the chart were reviewed this encounter and updated as appropriate:  Tobacco  Allergies  Meds  Problems  Med Hx  Surg Hx  Fam Hx      Objective  Well appearing patient in no apparent distress; mood and affect are within normal limits.  A full examination was performed including scalp, head, eyes, ears, nose, lips, neck, chest, axillae, abdomen, back, buttocks, bilateral upper extremities, bilateral lower extremities, hands, feet, fingers, toes, fingernails, and toenails. All findings within normal limits unless otherwise noted below.  Objective  Right Upper Outer Arm: Pink crust       Objective  Upper Mid Chest: Pearly papule        Objective  Left Dorsal Hand, Left Forehead, Mid Forehead: Erythematous stuck-on, waxy papule or plaque.   Assessment & Plan  Neoplasm of uncertain behavior of skin (2) Right Upper Outer Arm  Skin / nail biopsy Type of biopsy: tangential   Informed consent: discussed and consent obtained   Timeout: patient name, date of birth, surgical site, and procedure verified   Anesthesia: the lesion was anesthetized in a standard fashion   Anesthetic:  1% lidocaine w/ epinephrine 1-100,000 local infiltration Instrument used: flexible razor blade   Hemostasis achieved with: aluminum chloride and electrodesiccation   Outcome: patient tolerated procedure well   Post-procedure details: wound care instructions given    Specimen 1 - Surgical pathology Differential Diagnosis: R/O BCC vs SCC  Check Margins: No  Upper Mid Chest  Skin / nail biopsy Type of biopsy: tangential   Informed consent:  discussed and consent obtained   Timeout: patient name, date of birth, surgical site, and procedure verified   Anesthesia: the lesion was anesthetized in a standard fashion   Anesthetic:  1% lidocaine w/ epinephrine 1-100,000 local infiltration Instrument used: flexible razor blade   Hemostasis achieved with: aluminum chloride and electrodesiccation   Outcome: patient tolerated procedure well   Post-procedure details: wound care instructions given    Specimen 2 - Surgical pathology Differential Diagnosis: R/O BCC vs SCC  Check Margins: No  Inflamed seborrheic keratosis (3) Left Dorsal Hand; Mid Forehead; Left Forehead  Destruction of lesion - Left Dorsal Hand, Left Forehead, Mid Forehead Complexity: simple   Destruction method: cryotherapy   Informed consent: discussed and consent obtained   Timeout:  patient name, date of birth, surgical site, and procedure verified Lesion destroyed using liquid nitrogen: Yes   Cryotherapy cycles:  3 Outcome: patient tolerated procedure well with no complications   Post-procedure details: wound care instructions given    Lentigines - Scattered tan macules - Discussed due to sun exposure - Benign, observe - Call for any changes  Seborrheic Keratoses - Stuck-on, waxy, tan-brown papules and plaques  - Discussed benign etiology and prognosis. - Observe - Call for any changes  Melanocytic Nevi - Tan-brown and/or pink-flesh-colored symmetric macules and papules - Benign appearing on exam today - Observation - Call clinic for new or changing moles - Recommend daily use of broad spectrum spf 30+ sunscreen to sun-exposed areas.   Hemangiomas - Red papules - Discussed benign nature - Observe - Call  for any changes  Actinic Damage - diffuse scaly erythematous macules with underlying dyspigmentation - Recommend daily broad spectrum sunscreen SPF 30+ to sun-exposed areas, reapply every 2 hours as needed.  - Call for new or changing  lesions.  Skin cancer screening performed today.   I, Michiko Lineman, PA-C, have reviewed all documentation's for this visit.  The documentation on 08/24/20 for the exam, diagnosis, procedures and orders are all accurate and complete.

## 2020-09-29 DIAGNOSIS — L900 Lichen sclerosus et atrophicus: Secondary | ICD-10-CM | POA: Diagnosis not present

## 2020-09-29 DIAGNOSIS — B372 Candidiasis of skin and nail: Secondary | ICD-10-CM | POA: Diagnosis not present

## 2020-09-29 DIAGNOSIS — N898 Other specified noninflammatory disorders of vagina: Secondary | ICD-10-CM | POA: Diagnosis not present

## 2020-09-29 DIAGNOSIS — L292 Pruritus vulvae: Secondary | ICD-10-CM | POA: Diagnosis not present

## 2020-09-29 DIAGNOSIS — R3911 Hesitancy of micturition: Secondary | ICD-10-CM | POA: Diagnosis not present

## 2020-09-29 DIAGNOSIS — R351 Nocturia: Secondary | ICD-10-CM | POA: Diagnosis not present

## 2020-09-29 DIAGNOSIS — R3914 Feeling of incomplete bladder emptying: Secondary | ICD-10-CM | POA: Diagnosis not present

## 2020-10-04 DIAGNOSIS — Z23 Encounter for immunization: Secondary | ICD-10-CM | POA: Diagnosis not present

## 2020-11-25 ENCOUNTER — Ambulatory Visit (INDEPENDENT_AMBULATORY_CARE_PROVIDER_SITE_OTHER): Payer: Medicare Other

## 2020-11-25 ENCOUNTER — Encounter: Payer: Self-pay | Admitting: Surgical

## 2020-11-25 ENCOUNTER — Ambulatory Visit (INDEPENDENT_AMBULATORY_CARE_PROVIDER_SITE_OTHER): Payer: Medicare Other | Admitting: Surgical

## 2020-11-25 ENCOUNTER — Other Ambulatory Visit: Payer: Self-pay

## 2020-11-25 DIAGNOSIS — M25562 Pain in left knee: Secondary | ICD-10-CM | POA: Diagnosis not present

## 2020-11-25 DIAGNOSIS — G8929 Other chronic pain: Secondary | ICD-10-CM

## 2020-11-25 DIAGNOSIS — M545 Low back pain, unspecified: Secondary | ICD-10-CM

## 2020-11-25 DIAGNOSIS — M1712 Unilateral primary osteoarthritis, left knee: Secondary | ICD-10-CM | POA: Diagnosis not present

## 2020-12-14 ENCOUNTER — Ambulatory Visit (INDEPENDENT_AMBULATORY_CARE_PROVIDER_SITE_OTHER): Payer: Medicare Other | Admitting: Rehabilitative and Restorative Service Providers"

## 2020-12-14 ENCOUNTER — Encounter: Payer: Self-pay | Admitting: Rehabilitative and Restorative Service Providers"

## 2020-12-14 ENCOUNTER — Other Ambulatory Visit: Payer: Self-pay

## 2020-12-14 DIAGNOSIS — M542 Cervicalgia: Secondary | ICD-10-CM | POA: Diagnosis not present

## 2020-12-14 DIAGNOSIS — M6281 Muscle weakness (generalized): Secondary | ICD-10-CM | POA: Diagnosis not present

## 2020-12-14 DIAGNOSIS — G8929 Other chronic pain: Secondary | ICD-10-CM

## 2020-12-14 DIAGNOSIS — R293 Abnormal posture: Secondary | ICD-10-CM

## 2020-12-14 DIAGNOSIS — M545 Low back pain, unspecified: Secondary | ICD-10-CM | POA: Diagnosis not present

## 2020-12-14 DIAGNOSIS — M546 Pain in thoracic spine: Secondary | ICD-10-CM | POA: Diagnosis not present

## 2020-12-14 NOTE — Patient Instructions (Signed)
Access Code: [email or web address] URL: [email or web address] Date: 12/14/2020 Prepared by: Vista Mink  Exercises Scapular Retraction with Resistance - 1 x daily - 7 x weekly - 1-2 sets - 20 reps - 3 seconds hold Standing Scapular Retraction - 5 x daily - 7 x weekly - 1 sets - 5 reps - 5 second hold Standing Lumbar Extension at [place] - 5 x daily - 7 x weekly - 1 sets - 5 reps - 3 seconds hold Standing Isometric Cervical Extension with Manual Resistance - 3-5 x daily - 7 x weekly - 1 sets - 5 reps - 5 hold

## 2020-12-14 NOTE — Addendum Note (Signed)
Addended by: Maricela Bo on: 12/14/2020 05:14 PM   Modules accepted: Orders

## 2020-12-14 NOTE — Therapy (Signed)
[facility] Physical Therapy [address] [place], [place], [address] Phone: [phone]   Fax:  [phone]  Physical Therapy Treatment  Patient Details  Name: Donna Moreno MRN: [id number] Date of Birth: 07/25/1945 Referring Provider (PT): Donella Stade PA-C   Encounter Date: 12/14/2020   PT End of Session - 12/14/20 1704     Visit Number 1    Number of Visits 8    Date for PT Re-Evaluation 02/08/21    PT Start Time [id number]    PT Stop Time 1057    PT Time Calculation (min) 43 min    Activity Tolerance Patient tolerated treatment well;No increased pain    Behavior During Therapy [facility] for tasks assessed/performed             History reviewed. No pertinent past medical history.  History reviewed. No pertinent surgical history.  There were no vitals filed for this visit.   Subjective Assessment - 12/14/20 1700     Subjective Donna Moreno has overall spine pain with current symptoms located more mid-scapular and mid to lower thoracic.    Limitations Sitting;Lifting    Patient Stated Goals Decrease overall spine pain    Currently in Pain? Yes    Pain Score 3     Pain Location Back    Pain Orientation Mid    Pain Descriptors / Indicators Aching;Tightness    Pain Type Chronic pain    Pain Radiating Towards NA    Pain Onset More than a month ago    Pain Frequency Constant    Aggravating Factors  Prolonged sitting and other prolonged postures    Pain Relieving Factors Change of position    Effect of Pain on Daily Activities Pain is "nagging"    Multiple Pain Sites No                [facility] PT Assessment - 12/14/20 0001       Assessment   Medical Diagnosis Low back pain    Referring Provider (PT) Gerrianne Scale Magnant PA-C    Onset Date/Surgical Date --   Over a year     Precautions   Precautions Back    Precaution Comments Avoid bending/twisting/slouching      Restrictions   Weight Bearing Restrictions No      Balance Screen   Has the  patient fallen in the past 6 months No    Has the patient had a decrease in activity level because of a fear of falling?  No    Is the patient reluctant to leave their home because of a fear of falling?  No      Home Ecologist residence    Additional Comments No problem with stairs      Prior Function   Level of Independence Independent    Vocation Retired    Leisure Garden and walking      Cognition   Overall Cognitive Status Within Functional Limits for tasks assessed      Observation/Other Assessments   Focus on Therapeutic Outcomes (FOTO)  51 (Goal 61 by visit 11)      Posture/Postural Control   Posture/Postural Control Postural limitations    Postural Limitations Forward head;Rounded Shoulders;Decreased lumbar lordosis      ROM / Strength   AROM / PROM / Strength AROM;Strength      AROM   Overall AROM  Deficits    AROM Assessment Site Lumbar    Lumbar Extension 0  Strength   Overall Strength Deficits    Overall Strength Comments 19 seconds prone "Superwoman" test                           [facility] Adult PT Treatment/Exercise - 12/14/20 0001       Therapeutic Activites    Therapeutic Activities ADL's    ADL's Log roll, postural basics and review of spine anatomy as it relates to posture      Exercises   Exercises Lumbar      Lumbar Exercises: Standing   Scapular Retraction Strengthening;Both;10 reps;Limitations    Scapular Retraction Limitations 5 seconds shoulder blade pinches    Row Strengthening;Both;20 reps;Theraband    Theraband Level (Row) Level 4 (Blue)    Other Standing Lumbar Exercises Trunk extension AROM 10X 3 seconds      Lumbar Exercises: Seated   Other Seated Lumbar Exercises Cervical extension Isometrics 10X 5 seconds                    PT Education - 12/14/20 1703     Education Details Reviewed exam findings, basic posture and anatomy education and starter HEP.    Person(s)  Educated Patient    Methods Explanation;Demonstration;Verbal cues;Handout    Comprehension Verbal cues required;Returned demonstration;Need further instruction;Verbalized understanding              PT Short Term Goals - 12/14/20 1708       PT SHORT TERM GOAL #1   Title Donna Moreno will report overall spine pain consistently <3/10 on the Numeric Pain Rating Scale.    Baseline 3+/10    Time 4    Period Weeks    Status New    Target Date 01/11/21               PT Long Term Goals - 12/14/20 1709       PT LONG TERM GOAL #1   Title Improve FOTO to 61.    Baseline 51    Time 8    Period Weeks    Status New    Target Date 02/08/21      PT LONG TERM GOAL #2   Title Improve spine strength test hold time to at least 30 seconds without shaking/fatigue.    Baseline 19 seconds with shaking/fatigue immediately    Time 8    Period Weeks    Status New    Target Date 02/08/21      PT LONG TERM GOAL #3   Title Donna Moreno will be independent and compliant with her long-term HEP at [place].    Time 8    Period Weeks    Status New    Target Date 02/08/21                   Plan - 12/14/20 1704     Clinical Impression Statement Donna Moreno has overall spine pain that she would like to see improve.  She was able to hold the prone strength test position for 19 seconds (30-60 seconds expected) and she was very "shaky" during this test demonstrating muscle fatigue and weakness.  She will benefit tremendously from posture, body mechanics and spine strength work.  I anticipate 3-6 visits and am asking for 8 just in case she needs the additional feedback before transfer into independent rehabilitation.    Examination-Activity Limitations Bend;Squat;Lift    Examination-Participation Restrictions Community Activity    Stability/Clinical Decision Making Stable/Uncomplicated  Clinical Decision Making Low    Rehab Potential Good    PT Frequency 1x / week    PT Duration 8 weeks    PT  Treatment/Interventions ADLs/Self Care Home Management;Therapeutic activities;Therapeutic exercise;Neuromuscular re-education;Patient/family education;Manual techniques    PT Next Visit Plan Posture and practical body mechanics work.  Progress cervical, mid and low back strengthening.    PT Home Exercise Plan [email or web address]    Consulted and Agree with Plan of Care Patient             Patient will benefit from skilled therapeutic intervention in order to improve the following deficits and impairments:  Decreased activity tolerance, Decreased endurance, Decreased range of motion, Decreased strength, Impaired flexibility, Postural dysfunction, Improper body mechanics, Pain  Visit Diagnosis: Abnormal posture  Muscle weakness (generalized)  Chronic midline low back pain without sciatica  Cervicalgia  Thoracic spine pain     Problem List There are no problems to display for this patient.   Farley Ly PT, MPT 12/14/2020, 5:12 PM  [facility] Physical Therapy [address] [place], [place], [address] Phone: [phone]   Fax:  [phone]  Name: Donna Moreno MRN: [id number] Date of Birth: 02-10-1946

## 2021-01-03 DIAGNOSIS — Z1231 Encounter for screening mammogram for malignant neoplasm of breast: Secondary | ICD-10-CM | POA: Diagnosis not present

## 2021-01-03 DIAGNOSIS — M8589 Other specified disorders of bone density and structure, multiple sites: Secondary | ICD-10-CM | POA: Diagnosis not present

## 2021-01-03 DIAGNOSIS — Z780 Asymptomatic menopausal state: Secondary | ICD-10-CM | POA: Diagnosis not present

## 2021-01-05 ENCOUNTER — Encounter: Payer: Self-pay | Admitting: Surgical

## 2021-01-05 ENCOUNTER — Other Ambulatory Visit: Payer: Self-pay

## 2021-01-05 ENCOUNTER — Ambulatory Visit (INDEPENDENT_AMBULATORY_CARE_PROVIDER_SITE_OTHER): Payer: Medicare Other | Admitting: Orthopedic Surgery

## 2021-01-05 DIAGNOSIS — M25562 Pain in left knee: Secondary | ICD-10-CM

## 2021-01-05 DIAGNOSIS — G8929 Other chronic pain: Secondary | ICD-10-CM | POA: Diagnosis not present

## 2021-01-05 MED ORDER — LIDOCAINE HCL 1 % IJ SOLN
5.0000 mL | INTRAMUSCULAR | Status: AC | PRN
  Administered 2020-11-25: 5 mL

## 2021-01-05 MED ORDER — BUPIVACAINE HCL 0.25 % IJ SOLN
4.0000 mL | INTRAMUSCULAR | Status: AC | PRN
  Administered 2020-11-25: 4 mL via INTRA_ARTICULAR

## 2021-01-05 MED ORDER — METHYLPREDNISOLONE ACETATE 40 MG/ML IJ SUSP
40.0000 mg | INTRAMUSCULAR | Status: AC | PRN
  Administered 2020-11-25: 40 mg via INTRA_ARTICULAR

## 2021-01-05 NOTE — Progress Notes (Signed)
Office Visit Note   Patient: Donna Moreno           Date of Birth: 22-Sep-1945           MRN: [id number] Visit Date: 11/25/2020 Requested by: Carol Ada, [place],  [place] [address] PCP: Carol Ada, MD  Subjective: Chief Complaint  Patient presents with   Left Knee - Pain   Lower Back - Pain   Middle Back - Pain    HPI: Donna Moreno is a 75 y.o. female who presents to the office complaining of left knee pain and back pain.  Complains of lateral patellofemoral pain with grinding sensation.  She denies any recent injury but she did fall out of bed last July.  Pain is worse with walking and ascending stairs.  Can walk to the [facility] anymore.  She does have pain at rest but pain does not wake her up at night.  She has no history of surgery on the knee.  Denies any groin pain.  Patient also complains of back pain in the axial lumbar spine.  She has history of compression fracture that she thinks was around L2 in 1987 from playing tennis.  She notes some numbness in her left toes as well as some tingling but no significant radicular pain.  Takes ibuprofen which helps at times.  Pains been ongoing for 1 year.  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  Denies any bowel or bladder incontinence..                ROS: All systems reviewed are negative as they relate to the chief complaint within the history of present illness.  Patient denies fevers or chills.  Assessment & Plan: Visit Diagnoses:  1. Chronic pain of left knee   2. Low back pain, unspecified back pain laterality, unspecified chronicity, unspecified whether sciatica present     Plan: Patient is a 75 year old female who presents complaining of left knee and low back pain.  She has small patellar ossicle next to the lateral patella on sunrise view.  This is the most tender area of her knee on exam.  Discussed options available to patient.  She does not want to proceed with any  plan for excision or anything like that at this time.  She would like to try an injection.  This is reasonable.  I did injection today and patient tolerated procedure well.  Minimal arthritis of the left knee.  Additionally, with her axial lumbar spine pain, plan to try physical therapy upstairs for 1-2 sessions with designing of home exercise program that she will do on her own time.  Follow-up in 6 weeks for clinical recheck with Dr. Marlou Sa to see how she is doing.  Follow-Up Instructions: No follow-ups on file.   Orders:  Orders Placed This Encounter  Procedures   XR KNEE 3 VIEW LEFT   XR Lumbar Spine 2-3 Views   Ambulatory referral to Physical Therapy   No orders of the defined types were placed in this encounter.     Procedures: Large Joint Inj: L knee on 11/25/2020 10:12 AM Indications: diagnostic evaluation, joint swelling and pain Details: 18 G 1.5 in needle, superolateral approach  Arthrogram: No  Medications: 5 mL lidocaine 1 %; 40 mg methylPREDNISolone acetate 40 MG/ML; 4 mL bupivacaine 0.25 % Outcome: tolerated well, no immediate complications Procedure, treatment alternatives, risks and benefits explained, specific risks discussed. Consent was given by  the patient. Immediately prior to procedure a time out was called to verify the correct patient, procedure, equipment, support staff and site/side marked as required. Patient was prepped and draped in the usual sterile fashion.      Clinical Data: No additional findings.  Objective: Vital Signs: There were no vitals taken for this visit.  Physical Exam:  Constitutional: Patient appears well-developed HEENT:  Head: Normocephalic Eyes:EOM are normal Neck: Normal range of motion Cardiovascular: Normal rate Pulmonary/chest: Effort normal Neurologic: Patient is alert Skin: Skin is warm Psychiatric: Patient has normal mood and affect  Ortho Exam: Ortho exam demonstrates left knee with no effusion.  Tenderness to  palpation over the lateral patella.  No significant tenderness over the lateral or medial joint lines.  No calf tenderness.  Negative Homans' sign.  Able to form straight leg raise.  No pain with hip range of motion.  5/5 motor strength of bilateral hip flexion, quadricep, hamstring, dorsiflexion, plantarflexion.  Tenderness throughout the axial lumbar spine.  Tenderness over the paraspinal musculature.  Negative straight leg raise bilaterally.  2+ patellar tendon reflexes.  No evidence of clonus bilaterally.  Specialty Comments:  No specialty comments available.  Imaging: No results found.   PMFS History: There are no problems to display for this patient.  History reviewed. No pertinent past medical history.  History reviewed. No pertinent family history.  History reviewed. No pertinent surgical history. Social History   Occupational History   Not on file  Tobacco Use   Smoking status: Former   Smokeless tobacco: Never  Substance and Sexual Activity   Alcohol use: Not on file   Drug use: Not on file   Sexual activity: Not on file

## 2021-01-06 ENCOUNTER — Encounter: Payer: Medicare Other | Admitting: Rehabilitative and Restorative Service Providers"

## 2021-01-09 ENCOUNTER — Encounter: Payer: Self-pay | Admitting: Orthopedic Surgery

## 2021-01-10 ENCOUNTER — Encounter: Payer: Medicare Other | Admitting: Rehabilitative and Restorative Service Providers"

## 2021-01-11 NOTE — Progress Notes (Signed)
Office Visit Note   Patient: Donna Moreno           Date of Birth: Oct 07, 1945           MRN: [id number] Visit Date: 01/05/2021 Requested by: Carol Ada, [place] [address] PCP: Carol Ada, MD  Subjective: Chief Complaint  Patient presents with   Knee Pain    HPI: Donna Moreno is a patient with left knee pain and low back pain.  She has had pain in that left knee since March.  She had an injection about 6 weeks ago which only gave her 10% relief.  Pain localizes discretely to the equator of the patella on the lateral side.  She has a known history of osteopia and tried taking Fosamax but was not able to finish it.  She has been on Os-Cal and vitamin D since that time.  Has some difficulty going up and down stairs.  Denies much in the way of mechanical symptoms.              ROS: All systems reviewed are negative as they relate to the chief complaint within the history of present illness.  Patient denies  fevers or chills.   Assessment & Plan: Visit Diagnoses:  1. Chronic pain of left knee     Plan: Impression is left knee pain with lateral ossicle around the patella.  Seen best on the merchant view.  This is essentially where most if not all of her pain is localizing to.  She did not have great relief from an injection.  With consideration of surgical excision I think MRI scanning is indicated to evaluate for any type of increased bony edema around the lateral aspect of the patella.  Patient does not have overly tight retinaculum.  No history of patellar dislocation.  Follow-up after scan.  Follow-Up Instructions: Return for after MRI.   Orders:  No orders of the defined types were placed in this encounter.  No orders of the defined types were placed in this encounter.     Procedures: No procedures performed   Clinical Data: No additional findings.  Objective: Vital Signs: There were no vitals taken for this visit.  Physical  Exam:   Constitutional: Patient appears well-developed HEENT:  Head: Normocephalic Eyes:EOM are normal Neck: Normal range of motion Cardiovascular: Normal rate Pulmonary/chest: Effort normal Neurologic: Patient is alert Skin: Skin is warm Psychiatric: Patient has normal mood and affect   Ortho Exam: Ortho exam demonstrates no effusion in the left knee.  No nerve root tension signs.  No groin pain with internal ex rotation of the leg.  Pedal pulses palpable.  Patient does have discrete tenderness over the lateral aspect of the patella with negative apprehension and good patellar mobility medially and laterally.  Collateral and cruciate ligaments are stable.  No joint line tenderness is present.  No other masses lymphadenopathy or skin changes noted in that left knee region.  Range of motion is full.  Specialty Comments:  No specialty comments available.  Imaging: No results found.   PMFS History: There are no problems to display for this patient.  History reviewed. No pertinent past medical history.  History reviewed. No pertinent family history.  History reviewed. No pertinent surgical history. Social History   Occupational History   Not on file  Tobacco Use   Smoking status: Former   Smokeless tobacco: Never  Substance and Sexual Activity   Alcohol use: Not on  file   Drug use: Not on file   Sexual activity: Not on file

## 2021-01-12 NOTE — Addendum Note (Signed)
Addended byLaurann Montana on: 01/12/2021 08:54 AM   Modules accepted: Orders

## 2021-01-19 DIAGNOSIS — R928 Other abnormal and inconclusive findings on diagnostic imaging of breast: Secondary | ICD-10-CM | POA: Diagnosis not present

## 2021-01-19 DIAGNOSIS — N6489 Other specified disorders of breast: Secondary | ICD-10-CM | POA: Diagnosis not present

## 2021-01-19 DIAGNOSIS — R922 Inconclusive mammogram: Secondary | ICD-10-CM | POA: Diagnosis not present

## 2021-01-26 ENCOUNTER — Encounter: Payer: Self-pay | Admitting: Rehabilitative and Restorative Service Providers"

## 2021-01-26 ENCOUNTER — Ambulatory Visit (INDEPENDENT_AMBULATORY_CARE_PROVIDER_SITE_OTHER): Payer: Medicare Other | Admitting: Rehabilitative and Restorative Service Providers"

## 2021-01-26 ENCOUNTER — Other Ambulatory Visit: Payer: Self-pay

## 2021-01-26 DIAGNOSIS — M542 Cervicalgia: Secondary | ICD-10-CM | POA: Diagnosis not present

## 2021-01-26 DIAGNOSIS — M546 Pain in thoracic spine: Secondary | ICD-10-CM | POA: Diagnosis not present

## 2021-01-26 DIAGNOSIS — G8929 Other chronic pain: Secondary | ICD-10-CM

## 2021-01-26 DIAGNOSIS — M6281 Muscle weakness (generalized): Secondary | ICD-10-CM | POA: Diagnosis not present

## 2021-01-26 DIAGNOSIS — R293 Abnormal posture: Secondary | ICD-10-CM

## 2021-01-26 DIAGNOSIS — M545 Low back pain, unspecified: Secondary | ICD-10-CM | POA: Diagnosis not present

## 2021-01-26 NOTE — Patient Instructions (Signed)
Access Code: [email or web address] URL: [email or web address] Date: 01/26/2021 Prepared by: Vista Mink  Exercises Scapular Retraction with Resistance - 1 x daily - 7 x weekly - 1-2 sets - 20 reps - 3 seconds hold Standing Scapular Retraction - 5 x daily - 7 x weekly - 1 sets - 5 reps - 5 second hold Standing Lumbar Extension at [place] - 5 x daily - 7 x weekly - 1 sets - 5 reps - 3 seconds hold Standing Isometric Cervical Extension with Manual Resistance - 3 x daily - 7 x weekly - 1 sets - 5 reps - 5 hold Seated Cervical Rotation AROM - 3 x daily - 7 x weekly - 1 sets - 10 reps - 5 seconds hold Shoulder External Rotation with Anchored Resistance with Towel Under Elbow - 1 x daily - 3 x weekly - 2-3 sets - 10 reps - 3 hold Prone Hip Extension - 1 x daily - 7 x weekly - 2-3 sets - 10 reps - 3 seconds hold Prone Alternating Arm and Leg Lifts - 1 x daily - 7 x weekly - 2-3 sets - 10 reps - 3-10 seconds hold Prone Shoulder Horizontal Abduction with Thumbs Up - 1 x daily - 7 x weekly - 2-3 sets - 10 reps - 3 seconds hold

## 2021-01-26 NOTE — Therapy (Signed)
[facility] Physical Therapy [address] [place], [place], [address] Phone: [phone]   Fax:  [phone]  Physical Therapy Treatment  Patient Details  Name: Donna Moreno MRN: [id number] Date of Birth: 1945/08/01 Referring Provider (PT): Donella Stade PA-C   Encounter Date: 01/26/2021   PT End of Session - 01/26/21 1412     Visit Number 2    Number of Visits 8    Date for PT Re-Evaluation 02/08/21    PT Start Time 4818    PT Stop Time 1230    PT Time Calculation (min) 45 min    Activity Tolerance Patient tolerated treatment well;No increased pain    Behavior During Therapy [facility] for tasks assessed/performed             History reviewed. No pertinent past medical history.  History reviewed. No pertinent surgical history.  There were no vitals filed for this visit.   Subjective Assessment - 01/26/21 1148     Subjective Donna Moreno reports "about 40% improvement" since evaluation.  Ibuprofen is needed later in the day.    Limitations Sitting;Lifting    How long can you sit comfortably? 20 minutes    How long can you stand comfortably? Movement is better, static is tough    How long can you walk comfortably? 30 minutes    Patient Stated Goals Decrease overall spine pain    Currently in Pain? Yes    Pain Score 6     Pain Location Back    Pain Orientation Mid;Lower;Upper    Pain Descriptors / Indicators Tightness    Pain Type Chronic pain    Pain Radiating Towards Scapular and some B toe numbness    Pain Onset More than a month ago    Pain Frequency Constant    Aggravating Factors  Prolonged postures    Pain Relieving Factors Change of position    Effect of Pain on Daily Activities Pain is "nagging"    Multiple Pain Sites No                               [facility] Adult PT Treatment/Exercise - 01/26/21 0001       Posture/Postural Control   Posture/Postural Control Postural limitations    Postural Limitations Forward  head;Rounded Shoulders;Decreased lumbar lordosis      Therapeutic Activites    Therapeutic Activities ADL's    ADL's Log roll, postural basics and review of spine anatomy as it relates to posture, bed mobility      Exercises   Exercises Lumbar      Lumbar Exercises: Stretches   Other Lumbar Stretch Exercise Cervical rotation AROM 10X 3 seconds      Lumbar Exercises: Standing   Scapular Retraction Strengthening;Both;10 reps;Limitations    Scapular Retraction Limitations 5 seconds shoulder blade pinches    Row Strengthening;Both;20 reps;Theraband    Theraband Level (Row) Level 4 (Blue)    Other Standing Lumbar Exercises Trunk extension AROM 10X 3 seconds    Other Standing Lumbar Exercises Shoulder ER 10X 3 seconds Blue B      Lumbar Exercises: Prone   Straight Leg Raise 10 reps;3 seconds;Limitations    Straight Leg Raises Limitations 2 sets forehead on forearms    Opposite Arm/Leg Raise Left arm/Right leg;Right arm/Left leg;10 reps;3 seconds;Limitations    Opposite Arm/Leg Raise Limitations 2 sets palms in/toes back    Other Prone Lumbar Exercises Prone Ts (lead with thumbs, lift  head, arms, legs) 2 sets of 5 for 3 seconds                     PT Education - 01/26/21 1411     Education Details Reviewed HEP with progressions to spine strength.    Person(s) Educated Patient    Methods Explanation;Demonstration;Verbal cues;Handout    Comprehension Verbal cues required;Need further instruction;Returned demonstration;Verbalized understanding              PT Short Term Goals - 01/26/21 1411       PT SHORT TERM GOAL #1   Title Donna Moreno will report overall spine pain consistently <3/10 on the Numeric Pain Rating Scale.    Baseline 3+/10    Time 4    Period Weeks    Status New    Target Date 02/08/21               PT Long Term Goals - 12/14/20 1709       PT LONG TERM GOAL #1   Title Improve FOTO to 61.    Baseline 51    Time 8    Period Weeks    Status  New    Target Date 02/08/21      PT LONG TERM GOAL #2   Title Improve spine strength test hold time to at least 30 seconds without shaking/fatigue.    Baseline 19 seconds with shaking/fatigue immediately    Time 8    Period Weeks    Status New    Target Date 02/08/21      PT LONG TERM GOAL #3   Title Donna Moreno will be independent and compliant with her long-term HEP at [place].    Time 8    Period Weeks    Status New    Target Date 02/08/21                   Plan - 01/26/21 1412     Clinical Impression Statement Donna Moreno just returned to PT after her evaluation ~ 6 weeks ago due to some work being done around her neighborhood.  She reports "40% improvement" in her symptoms just with her starter exercises.  Progressions and corrections were made today.  We will RA next visit including FOTO to assess objectively her progress and possibly extend her authorization given her early progress and long gap between eval and follow-up.    Examination-Activity Limitations Bend;Squat;Lift    Examination-Participation Restrictions Community Activity    Stability/Clinical Decision Making Stable/Uncomplicated    Rehab Potential Good    PT Frequency 1x / week    PT Duration 6 weeks    PT Treatment/Interventions ADLs/Self Care Home Management;Therapeutic activities;Therapeutic exercise;Neuromuscular re-education;Patient/family education;Manual techniques    PT Next Visit Plan Posture and practical body mechanics work.  Progress cervical, mid and low back strengthening.  RA including FOTO    PT Home Exercise Plan [email or web address]    Consulted and Agree with Plan of Care Patient             Patient will benefit from skilled therapeutic intervention in order to improve the following deficits and impairments:  Decreased activity tolerance, Decreased endurance, Decreased range of motion, Decreased strength, Impaired flexibility, Postural dysfunction, Improper body mechanics, Pain  Visit Diagnosis: Abnormal  posture  Muscle weakness (generalized)  Chronic midline low back pain without sciatica  Cervicalgia  Thoracic spine pain     Problem List There are no problems to display for this patient.  Farley Ly, PT, MPT 01/26/2021, 2:16 PM  [facility] Physical Therapy [address] [place], [place], [address] Phone: [phone]   Fax:  [phone]  Name: Donna Moreno MRN: [id number] Date of Birth: 11/13/1945

## 2021-02-06 DIAGNOSIS — M797 Fibromyalgia: Secondary | ICD-10-CM | POA: Diagnosis not present

## 2021-02-06 DIAGNOSIS — E7800 Pure hypercholesterolemia, unspecified: Secondary | ICD-10-CM | POA: Diagnosis not present

## 2021-02-06 DIAGNOSIS — Z1389 Encounter for screening for other disorder: Secondary | ICD-10-CM | POA: Diagnosis not present

## 2021-02-06 DIAGNOSIS — G4700 Insomnia, unspecified: Secondary | ICD-10-CM | POA: Diagnosis not present

## 2021-02-06 DIAGNOSIS — Z0000 Encounter for general adult medical examination without abnormal findings: Secondary | ICD-10-CM | POA: Diagnosis not present

## 2021-02-06 DIAGNOSIS — I10 Essential (primary) hypertension: Secondary | ICD-10-CM | POA: Diagnosis not present

## 2021-02-06 DIAGNOSIS — Z23 Encounter for immunization: Secondary | ICD-10-CM | POA: Diagnosis not present

## 2021-02-08 ENCOUNTER — Encounter: Payer: Self-pay | Admitting: Rehabilitative and Restorative Service Providers"

## 2021-02-08 ENCOUNTER — Ambulatory Visit (INDEPENDENT_AMBULATORY_CARE_PROVIDER_SITE_OTHER): Payer: Medicare Other | Admitting: Rehabilitative and Restorative Service Providers"

## 2021-02-08 ENCOUNTER — Other Ambulatory Visit: Payer: Self-pay

## 2021-02-08 DIAGNOSIS — G8929 Other chronic pain: Secondary | ICD-10-CM | POA: Diagnosis not present

## 2021-02-08 DIAGNOSIS — M542 Cervicalgia: Secondary | ICD-10-CM | POA: Diagnosis not present

## 2021-02-08 DIAGNOSIS — M6281 Muscle weakness (generalized): Secondary | ICD-10-CM

## 2021-02-08 DIAGNOSIS — M546 Pain in thoracic spine: Secondary | ICD-10-CM | POA: Diagnosis not present

## 2021-02-08 DIAGNOSIS — R293 Abnormal posture: Secondary | ICD-10-CM | POA: Diagnosis not present

## 2021-02-08 DIAGNOSIS — M545 Low back pain, unspecified: Secondary | ICD-10-CM | POA: Diagnosis not present
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
## 2021-02-08 NOTE — Patient Instructions (Signed)
Continue current HEP.  Corrections made with prone exercises.  Added SBP with hike.

## 2021-02-09 ENCOUNTER — Ambulatory Visit
Admission: RE | Admit: 2021-02-09 | Discharge: 2021-02-09 | Disposition: A | Discharge status: Home / Self Care | Payer: Medicare Other | Source: Ambulatory Visit | Attending: Orthopedic Surgery | Admitting: Orthopedic Surgery

## 2021-02-09 DIAGNOSIS — M25562 Pain in left knee: Secondary | ICD-10-CM

## 2021-02-09 DIAGNOSIS — G8929 Other chronic pain: Secondary | ICD-10-CM

## 2021-02-13 ENCOUNTER — Ambulatory Visit (INDEPENDENT_AMBULATORY_CARE_PROVIDER_SITE_OTHER): Payer: Medicare Other | Admitting: Orthopedic Surgery

## 2021-02-13 ENCOUNTER — Other Ambulatory Visit: Payer: Self-pay

## 2021-02-13 DIAGNOSIS — M1712 Unilateral primary osteoarthritis, left knee: Secondary | ICD-10-CM | POA: Diagnosis not present

## 2021-02-14 NOTE — Progress Notes (Signed)
Office Visit Note   Patient: Donna Moreno           Date of Birth: Aug 25, 1945           MRN: [id number] Visit Date: 02/13/2021 Requested by: Carol Ada, MD [address] [place],  [place] [id number] PCP: Carol Ada, MD  Subjective: Chief Complaint  Patient presents with   Other    Scan review    HPI: Donna Moreno is a 75 y.o. female who presents to the office for MRI review. Patient denies any changes in symptoms.  Continues to complain mainly of left knee pain that she localizes to the lateral patella.  No mechanical symptoms.  She does report that following her knee injection she hit her knee into a door several days after the injection and this gave her excellent relief after the impaction to the point where she had sustained relief of her knee pain for several months.  This is now starting to wear off.  MRI results revealed: MR Knee Left w/o contrast  Result Date: 02/11/2021 CLINICAL DATA:  Left knee pain for 4 months EXAM: MRI OF THE LEFT KNEE WITHOUT CONTRAST TECHNIQUE: Multiplanar, multisequence MR imaging of the knee was performed. No intravenous contrast was administered. COMPARISON:  None. FINDINGS: MENISCI Medial: Intact. Lateral: Intact. LIGAMENTS Cruciates: ACL and PCL are intact. Collaterals: Medial collateral ligament is intact. Lateral collateral ligament complex is intact. CARTILAGE Patellofemoral: High-grade partial-thickness cartilage loss of the patella with areas of full-thickness cartilage loss of the patellar apex and lateral patellar facet and subchondral reactive marrow changes. Medial: Mild partial-thickness cartilage loss of the medial femorotibial compartment. Lateral: Mild partial-thickness cartilage loss of the lateral femorotibial compartment. JOINT: Small joint effusion. Mild edema in superolateral Hoffa's fat. No plical thickening. POPLITEAL FOSSA: Popliteus tendon is intact. Small Baker's cyst. EXTENSOR MECHANISM: Intact quadriceps  tendon. Intact patellar tendon. Intact lateral patellar retinaculum. Intact medial patellar retinaculum. Intact MPFL. BONES: No aggressive osseous lesion. No fracture or dislocation. Other: No fluid collection or hematoma. Muscles are normal. IMPRESSION: 1. No meniscal or ligamentous injury of the left knee. 2. Tricompartmental cartilage abnormalities as described above most severe in the patellofemoral compartment. 3. Mild edema in superolateral Hoffa's fat as can be seen with patellar tendon-lateral femoral condyle friction syndrome. Electronically Signed   By: Kathreen Devoid M.D.   On: 02/11/2021 08:06                 ROS: All systems reviewed are negative as they relate to the chief complaint within the history of present illness.  Patient denies fevers or chills.  Assessment & Plan: Visit Diagnoses:  1. Osteoarthritis of left patellofemoral joint     Plan: Donna Moreno is a 75 y.o. female who presents to the office for review of MRI scan of the left knee.  She has mild partial-thickness cartilage loss of the medial lateral compartments with high-grade partial-thickness and full-thickness cartilage loss of the patellofemoral compartment, particularly over the apex of the patella and lateral patellar facet and associated with subchondral marrow edema.  There is a lateral patellar ossicle that is noted with fluid between the ossicle and the lateral patella facet on series 3 image 10.  Based on the MRI scan there is more wear and tear in the patellofemoral joint than was evident on radiographs.  Discussed options available to patient.  Seems that some of her symptoms may be coming from the patellar ossicle based on her physical  exam at previous visits and today.  She does not really have any pain with patellar mobility or patellar grind test or compression of the patella in the trochlea compared with simple palpation specifically over the lateral patella; this would suggest the patellar ossicle and the  tenuous fibrous union to the patella may be the primary culprit of her symptoms.  However, with the moderate degenerative changes evident on the lateral patella on MRI scan, cannot guarantee that her pain is not just referred from the patellofemoral joint.  Options really come down to living with this pain with occasional injections versus surgical excision of the lateral patellar ossicle.  Think there is probably a 50% chance it helps her versus 50% chance that it does not really do anything for her.  After discussion, Donna Moreno would like to hold off on any surgical intervention for now but she will call the office if she decides she is interested in pursuing surgical intervention or repeat injection.  Follow-Up Instructions: No follow-ups on file.   Orders:  No orders of the defined types were placed in this encounter.  No orders of the defined types were placed in this encounter.     Procedures: No procedures performed   Clinical Data: No additional findings.  Objective: Vital Signs: There were no vitals taken for this visit.  Physical Exam:  Constitutional: Patient appears well-developed HEENT:  Head: Normocephalic Eyes:EOM are normal Neck: Normal range of motion Cardiovascular: Normal rate Pulmonary/chest: Effort normal Neurologic: Patient is alert Skin: Skin is warm Psychiatric: Patient has normal mood and affect  Ortho Exam: Ortho exam demonstrates left knee with no effusion.  0 degrees extension and greater than 90 degrees of knee flexion.  Mild tenderness over the medial joint line.  No tenderness over the lateral joint line.  Very minimal patellofemoral crepitus.  Negative patellar grind test.  No pain with compression of the patella and the trochlea.  She does have point tenderness over the lateral patellar facet.  Specialty Comments:  No specialty comments available.  Imaging: No results found.   PMFS History: There are no problems to display for this patient.  No  past medical history on file.  No family history on file.  No past surgical history on file. Social History   Occupational History   Not on file  Tobacco Use   Smoking status: Former   Smokeless tobacco: Never  Substance and Sexual Activity   Alcohol use: Not on file   Drug use: Not on file   Sexual activity: Not on file

## 2021-02-15 ENCOUNTER — Encounter: Payer: Medicare Other | Admitting: Rehabilitative and Restorative Service Providers"

## 2021-02-19 ENCOUNTER — Encounter: Payer: Self-pay | Admitting: Orthopedic Surgery

## 2021-02-22 ENCOUNTER — Encounter: Payer: Medicare Other | Admitting: Rehabilitative and Restorative Service Providers"

## 2021-03-14 DIAGNOSIS — I10 Essential (primary) hypertension: Secondary | ICD-10-CM | POA: Diagnosis not present

## 2021-03-14 DIAGNOSIS — M797 Fibromyalgia: Secondary | ICD-10-CM | POA: Diagnosis not present

## 2021-03-14 DIAGNOSIS — E7800 Pure hypercholesterolemia, unspecified: Secondary | ICD-10-CM | POA: Diagnosis not present

## 2021-04-18 DIAGNOSIS — J40 Bronchitis, not specified as acute or chronic: Secondary | ICD-10-CM | POA: Diagnosis not present

## 2021-04-18 DIAGNOSIS — R059 Cough, unspecified: Secondary | ICD-10-CM | POA: Diagnosis not present

## 2021-08-08 DIAGNOSIS — H16223 Keratoconjunctivitis sicca, not specified as Sjogren's, bilateral: Secondary | ICD-10-CM | POA: Diagnosis not present

## 2021-08-09 ENCOUNTER — Ambulatory Visit: Payer: Medicare Other | Admitting: Physician Assistant

## 2021-08-15 ENCOUNTER — Encounter: Payer: Self-pay | Admitting: Physician Assistant

## 2021-08-15 ENCOUNTER — Ambulatory Visit (INDEPENDENT_AMBULATORY_CARE_PROVIDER_SITE_OTHER): Payer: Medicare Other | Admitting: Physician Assistant

## 2021-08-15 DIAGNOSIS — L820 Inflamed seborrheic keratosis: Secondary | ICD-10-CM | POA: Diagnosis not present

## 2021-08-15 DIAGNOSIS — Z1283 Encounter for screening for malignant neoplasm of skin: Secondary | ICD-10-CM | POA: Diagnosis not present

## 2021-08-15 DIAGNOSIS — Z85828 Personal history of other malignant neoplasm of skin: Secondary | ICD-10-CM | POA: Diagnosis not present

## 2021-08-15 NOTE — Progress Notes (Signed)
? ?  Follow-Up Visit ?  ?Subjective  ?Donna Moreno is a 76 y.o. 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 No family history of non mole skin cancer or melanoma. ). ? ? ?The following portions of the chart were reviewed this encounter and updated as appropriate:  Tobacco  Allergies  Meds  Problems  Med Hx  Surg Hx  Fam Hx   ?  ? ?Objective  ?Well appearing patient in no apparent distress; mood and affect are within normal limits. ? ?A full examination was performed including scalp, head, eyes, ears, nose, lips, neck, chest, axillae, abdomen, back, buttocks, bilateral upper extremities, bilateral lower extremities, hands, feet, fingers, toes, fingernails, and toenails. All findings within normal limits unless otherwise noted below. ? ?No atypical nevi No signs of non-mole skin cancer.  ? ?Left Inframammary Fold (8), Left Shoulder - Anterior, Left Upper Back (2), Left Zygomatic Area (3), Mid Forehead, Right Anterior Neck, Right Inframammary Fold (6), Right Upper Back (2) ?Stuck-on, waxy, tan-brown plaque on an erythematous base.  ? ? ?Assessment & Plan  ?Encounter for screening for malignant neoplasm of skin ? ?Yearly skin examinations. ? ?Seborrheic keratosis, inflamed (24) ?Left Shoulder - Anterior; Left Inframammary Fold (8); Right Inframammary Fold (6); Left Upper Back (2); Right Upper Back (2); Mid Forehead; Left Zygomatic Area (3); Right Anterior Neck ? ?Destruction of lesion - Left Inframammary Fold, Left Shoulder - Anterior, Left Upper Back, Left Zygomatic Area, Mid Forehead, Right Anterior Neck, Right Inframammary Fold, Right Upper Back ?Complexity: simple   ?Destruction method: cryotherapy   ?Informed consent: discussed and consent obtained   ?Timeout:  patient name, date of birth, surgical site, and procedure verified ?Lesion destroyed using liquid nitrogen: Yes   ?Cryotherapy cycles:  3 ?Outcome: patient tolerated  procedure well with no complications   ? ? ? ?I, Ludger Bones, PA-C, have reviewed all documentation's for this visit.  The documentation on 08/15/21 for the exam, diagnosis, procedures and orders are all accurate and complete. ?

## 2021-09-04 DIAGNOSIS — L900 Lichen sclerosus et atrophicus: Secondary | ICD-10-CM | POA: Diagnosis not present

## 2021-09-04 DIAGNOSIS — N8110 Cystocele, unspecified: Secondary | ICD-10-CM | POA: Diagnosis not present

## 2021-09-04 DIAGNOSIS — N952 Postmenopausal atrophic vaginitis: Secondary | ICD-10-CM | POA: Diagnosis not present

## 2021-09-13 DIAGNOSIS — K219 Gastro-esophageal reflux disease without esophagitis: Secondary | ICD-10-CM | POA: Diagnosis not present

## 2021-09-13 DIAGNOSIS — Z86010 Personal history of colonic polyps: Secondary | ICD-10-CM | POA: Diagnosis not present

## 2021-09-29 DIAGNOSIS — N952 Postmenopausal atrophic vaginitis: Secondary | ICD-10-CM | POA: Diagnosis not present

## 2021-09-29 DIAGNOSIS — N8110 Cystocele, unspecified: Secondary | ICD-10-CM | POA: Diagnosis not present

## 2021-09-29 DIAGNOSIS — Z4689 Encounter for fitting and adjustment of other specified devices: Secondary | ICD-10-CM | POA: Diagnosis not present

## 2021-10-10 DIAGNOSIS — E7800 Pure hypercholesterolemia, unspecified: Secondary | ICD-10-CM | POA: Diagnosis not present

## 2021-10-10 DIAGNOSIS — I10 Essential (primary) hypertension: Secondary | ICD-10-CM | POA: Diagnosis not present

## 2021-10-10 DIAGNOSIS — M797 Fibromyalgia: Secondary | ICD-10-CM | POA: Diagnosis not present

## 2021-10-10 DIAGNOSIS — K219 Gastro-esophageal reflux disease without esophagitis: Secondary | ICD-10-CM | POA: Diagnosis not present

## 2021-10-10 DIAGNOSIS — Z23 Encounter for immunization: Secondary | ICD-10-CM | POA: Diagnosis not present

## 2021-10-10 DIAGNOSIS — G4700 Insomnia, unspecified: Secondary | ICD-10-CM | POA: Diagnosis not present

## 2021-11-24 DIAGNOSIS — Z4689 Encounter for fitting and adjustment of other specified devices: Secondary | ICD-10-CM | POA: Diagnosis not present

## 2021-12-18 DIAGNOSIS — Z4689 Encounter for fitting and adjustment of other specified devices: Secondary | ICD-10-CM | POA: Diagnosis not present

## 2021-12-22 DIAGNOSIS — D123 Benign neoplasm of transverse colon: Secondary | ICD-10-CM | POA: Diagnosis not present

## 2021-12-22 DIAGNOSIS — Z86010 Personal history of colonic polyps: Secondary | ICD-10-CM | POA: Diagnosis not present

## 2021-12-22 DIAGNOSIS — K648 Other hemorrhoids: Secondary | ICD-10-CM | POA: Diagnosis not present

## 2021-12-22 DIAGNOSIS — K2930 Chronic superficial gastritis without bleeding: Secondary | ICD-10-CM | POA: Diagnosis not present

## 2021-12-22 DIAGNOSIS — K5140 Inflammatory polyps of colon without complications: Secondary | ICD-10-CM | POA: Diagnosis not present

## 2021-12-22 DIAGNOSIS — K317 Polyp of stomach and duodenum: Secondary | ICD-10-CM | POA: Diagnosis not present

## 2021-12-22 DIAGNOSIS — R1013 Epigastric pain: Secondary | ICD-10-CM | POA: Diagnosis not present

## 2021-12-22 DIAGNOSIS — K449 Diaphragmatic hernia without obstruction or gangrene: Secondary | ICD-10-CM | POA: Diagnosis not present

## 2021-12-22 DIAGNOSIS — Z09 Encounter for follow-up examination after completed treatment for conditions other than malignant neoplasm: Secondary | ICD-10-CM | POA: Diagnosis not present

## 2021-12-26 DIAGNOSIS — K5140 Inflammatory polyps of colon without complications: Secondary | ICD-10-CM | POA: Diagnosis not present

## 2021-12-26 DIAGNOSIS — K2930 Chronic superficial gastritis without bleeding: Secondary | ICD-10-CM | POA: Diagnosis not present

## 2021-12-26 DIAGNOSIS — K317 Polyp of stomach and duodenum: Secondary | ICD-10-CM | POA: Diagnosis not present

## 2021-12-26 DIAGNOSIS — D123 Benign neoplasm of transverse colon: Secondary | ICD-10-CM | POA: Diagnosis not present

## 2022-01-16 DIAGNOSIS — Z1231 Encounter for screening mammogram for malignant neoplasm of breast: Secondary | ICD-10-CM | POA: Diagnosis not present

## 2022-02-19 DIAGNOSIS — J0100 Acute maxillary sinusitis, unspecified: Secondary | ICD-10-CM | POA: Diagnosis not present

## 2022-02-28 DIAGNOSIS — E7800 Pure hypercholesterolemia, unspecified: Secondary | ICD-10-CM | POA: Diagnosis not present

## 2022-02-28 DIAGNOSIS — M797 Fibromyalgia: Secondary | ICD-10-CM | POA: Diagnosis not present

## 2022-02-28 DIAGNOSIS — Z23 Encounter for immunization: Secondary | ICD-10-CM | POA: Diagnosis not present

## 2022-02-28 DIAGNOSIS — G4700 Insomnia, unspecified: Secondary | ICD-10-CM | POA: Diagnosis not present

## 2022-02-28 DIAGNOSIS — Z0000 Encounter for general adult medical examination without abnormal findings: Secondary | ICD-10-CM | POA: Diagnosis not present

## 2022-02-28 DIAGNOSIS — Z1331 Encounter for screening for depression: Secondary | ICD-10-CM | POA: Diagnosis not present

## 2022-02-28 DIAGNOSIS — I10 Essential (primary) hypertension: Secondary | ICD-10-CM | POA: Diagnosis not present

## 2022-04-23 DIAGNOSIS — Z23 Encounter for immunization: Secondary | ICD-10-CM | POA: Diagnosis not present

## 2022-04-28 DIAGNOSIS — U071 COVID-19: Secondary | ICD-10-CM | POA: Diagnosis not present

## 2022-05-30 DIAGNOSIS — M546 Pain in thoracic spine: Secondary | ICD-10-CM | POA: Diagnosis not present

## 2022-05-30 DIAGNOSIS — M62830 Muscle spasm of back: Secondary | ICD-10-CM | POA: Diagnosis not present

## 2022-05-30 DIAGNOSIS — M9902 Segmental and somatic dysfunction of thoracic region: Secondary | ICD-10-CM | POA: Diagnosis not present

## 2022-05-30 DIAGNOSIS — M9904 Segmental and somatic dysfunction of sacral region: Secondary | ICD-10-CM | POA: Diagnosis not present

## 2022-05-30 DIAGNOSIS — M9900 Segmental and somatic dysfunction of head region: Secondary | ICD-10-CM | POA: Diagnosis not present

## 2022-05-30 DIAGNOSIS — M9903 Segmental and somatic dysfunction of lumbar region: Secondary | ICD-10-CM | POA: Diagnosis not present

## 2022-05-30 DIAGNOSIS — M542 Cervicalgia: Secondary | ICD-10-CM | POA: Diagnosis not present

## 2022-05-30 DIAGNOSIS — M5136 Other intervertebral disc degeneration, lumbar region: Secondary | ICD-10-CM | POA: Diagnosis not present

## 2022-05-30 DIAGNOSIS — M9901 Segmental and somatic dysfunction of cervical region: Secondary | ICD-10-CM | POA: Diagnosis not present

## 2022-06-04 DIAGNOSIS — M546 Pain in thoracic spine: Secondary | ICD-10-CM | POA: Diagnosis not present

## 2022-06-04 DIAGNOSIS — M62830 Muscle spasm of back: Secondary | ICD-10-CM | POA: Diagnosis not present

## 2022-06-04 DIAGNOSIS — M9901 Segmental and somatic dysfunction of cervical region: Secondary | ICD-10-CM | POA: Diagnosis not present

## 2022-06-04 DIAGNOSIS — Z4689 Encounter for fitting and adjustment of other specified devices: Secondary | ICD-10-CM | POA: Diagnosis not present

## 2022-06-04 DIAGNOSIS — M9902 Segmental and somatic dysfunction of thoracic region: Secondary | ICD-10-CM | POA: Diagnosis not present

## 2022-06-04 DIAGNOSIS — M9904 Segmental and somatic dysfunction of sacral region: Secondary | ICD-10-CM | POA: Diagnosis not present

## 2022-06-04 DIAGNOSIS — M9900 Segmental and somatic dysfunction of head region: Secondary | ICD-10-CM | POA: Diagnosis not present

## 2022-06-04 DIAGNOSIS — M9903 Segmental and somatic dysfunction of lumbar region: Secondary | ICD-10-CM | POA: Diagnosis not present

## 2022-06-04 DIAGNOSIS — M542 Cervicalgia: Secondary | ICD-10-CM | POA: Diagnosis not present

## 2022-06-04 DIAGNOSIS — M5136 Other intervertebral disc degeneration, lumbar region: Secondary | ICD-10-CM | POA: Diagnosis not present

## 2022-06-05 DIAGNOSIS — M546 Pain in thoracic spine: Secondary | ICD-10-CM | POA: Diagnosis not present

## 2022-06-05 DIAGNOSIS — M542 Cervicalgia: Secondary | ICD-10-CM | POA: Diagnosis not present

## 2022-06-05 DIAGNOSIS — M5136 Other intervertebral disc degeneration, lumbar region: Secondary | ICD-10-CM | POA: Diagnosis not present

## 2022-06-05 DIAGNOSIS — M9903 Segmental and somatic dysfunction of lumbar region: Secondary | ICD-10-CM | POA: Diagnosis not present

## 2022-06-05 DIAGNOSIS — M9904 Segmental and somatic dysfunction of sacral region: Secondary | ICD-10-CM | POA: Diagnosis not present

## 2022-06-05 DIAGNOSIS — M9902 Segmental and somatic dysfunction of thoracic region: Secondary | ICD-10-CM | POA: Diagnosis not present

## 2022-06-05 DIAGNOSIS — M9900 Segmental and somatic dysfunction of head region: Secondary | ICD-10-CM | POA: Diagnosis not present

## 2022-06-05 DIAGNOSIS — M62830 Muscle spasm of back: Secondary | ICD-10-CM | POA: Diagnosis not present

## 2022-06-05 DIAGNOSIS — M9901 Segmental and somatic dysfunction of cervical region: Secondary | ICD-10-CM | POA: Diagnosis not present

## 2022-06-11 DIAGNOSIS — M9901 Segmental and somatic dysfunction of cervical region: Secondary | ICD-10-CM | POA: Diagnosis not present

## 2022-06-11 DIAGNOSIS — M542 Cervicalgia: Secondary | ICD-10-CM | POA: Diagnosis not present

## 2022-06-11 DIAGNOSIS — M62830 Muscle spasm of back: Secondary | ICD-10-CM | POA: Diagnosis not present

## 2022-06-11 DIAGNOSIS — M9903 Segmental and somatic dysfunction of lumbar region: Secondary | ICD-10-CM | POA: Diagnosis not present

## 2022-06-11 DIAGNOSIS — M9904 Segmental and somatic dysfunction of sacral region: Secondary | ICD-10-CM | POA: Diagnosis not present

## 2022-06-11 DIAGNOSIS — M9902 Segmental and somatic dysfunction of thoracic region: Secondary | ICD-10-CM | POA: Diagnosis not present

## 2022-06-11 DIAGNOSIS — M546 Pain in thoracic spine: Secondary | ICD-10-CM | POA: Diagnosis not present

## 2022-06-11 DIAGNOSIS — M9900 Segmental and somatic dysfunction of head region: Secondary | ICD-10-CM | POA: Diagnosis not present

## 2022-06-11 DIAGNOSIS — M5136 Other intervertebral disc degeneration, lumbar region: Secondary | ICD-10-CM | POA: Diagnosis not present

## 2022-06-13 DIAGNOSIS — M9900 Segmental and somatic dysfunction of head region: Secondary | ICD-10-CM | POA: Diagnosis not present

## 2022-06-13 DIAGNOSIS — M9903 Segmental and somatic dysfunction of lumbar region: Secondary | ICD-10-CM | POA: Diagnosis not present

## 2022-06-13 DIAGNOSIS — M9904 Segmental and somatic dysfunction of sacral region: Secondary | ICD-10-CM | POA: Diagnosis not present

## 2022-06-13 DIAGNOSIS — M9901 Segmental and somatic dysfunction of cervical region: Secondary | ICD-10-CM | POA: Diagnosis not present

## 2022-06-13 DIAGNOSIS — M9902 Segmental and somatic dysfunction of thoracic region: Secondary | ICD-10-CM | POA: Diagnosis not present

## 2022-06-13 DIAGNOSIS — M62830 Muscle spasm of back: Secondary | ICD-10-CM | POA: Diagnosis not present

## 2022-06-13 DIAGNOSIS — M546 Pain in thoracic spine: Secondary | ICD-10-CM | POA: Diagnosis not present

## 2022-06-13 DIAGNOSIS — M542 Cervicalgia: Secondary | ICD-10-CM | POA: Diagnosis not present

## 2022-06-13 DIAGNOSIS — M5136 Other intervertebral disc degeneration, lumbar region: Secondary | ICD-10-CM | POA: Diagnosis not present

## 2022-06-15 DIAGNOSIS — J342 Deviated nasal septum: Secondary | ICD-10-CM | POA: Diagnosis not present

## 2022-06-15 DIAGNOSIS — H93293 Other abnormal auditory perceptions, bilateral: Secondary | ICD-10-CM | POA: Diagnosis not present

## 2022-06-15 DIAGNOSIS — H9313 Tinnitus, bilateral: Secondary | ICD-10-CM | POA: Diagnosis not present

## 2022-06-18 DIAGNOSIS — M62830 Muscle spasm of back: Secondary | ICD-10-CM | POA: Diagnosis not present

## 2022-06-18 DIAGNOSIS — M9903 Segmental and somatic dysfunction of lumbar region: Secondary | ICD-10-CM | POA: Diagnosis not present

## 2022-06-18 DIAGNOSIS — M9900 Segmental and somatic dysfunction of head region: Secondary | ICD-10-CM | POA: Diagnosis not present

## 2022-06-18 DIAGNOSIS — M9901 Segmental and somatic dysfunction of cervical region: Secondary | ICD-10-CM | POA: Diagnosis not present

## 2022-06-18 DIAGNOSIS — M9902 Segmental and somatic dysfunction of thoracic region: Secondary | ICD-10-CM | POA: Diagnosis not present

## 2022-06-18 DIAGNOSIS — M546 Pain in thoracic spine: Secondary | ICD-10-CM | POA: Diagnosis not present

## 2022-06-18 DIAGNOSIS — M5136 Other intervertebral disc degeneration, lumbar region: Secondary | ICD-10-CM | POA: Diagnosis not present

## 2022-06-18 DIAGNOSIS — M542 Cervicalgia: Secondary | ICD-10-CM | POA: Diagnosis not present

## 2022-06-18 DIAGNOSIS — M9904 Segmental and somatic dysfunction of sacral region: Secondary | ICD-10-CM | POA: Diagnosis not present

## 2022-06-20 DIAGNOSIS — M9900 Segmental and somatic dysfunction of head region: Secondary | ICD-10-CM | POA: Diagnosis not present

## 2022-06-20 DIAGNOSIS — M542 Cervicalgia: Secondary | ICD-10-CM | POA: Diagnosis not present

## 2022-06-20 DIAGNOSIS — M9902 Segmental and somatic dysfunction of thoracic region: Secondary | ICD-10-CM | POA: Diagnosis not present

## 2022-06-20 DIAGNOSIS — M9904 Segmental and somatic dysfunction of sacral region: Secondary | ICD-10-CM | POA: Diagnosis not present

## 2022-06-20 DIAGNOSIS — M9903 Segmental and somatic dysfunction of lumbar region: Secondary | ICD-10-CM | POA: Diagnosis not present

## 2022-06-20 DIAGNOSIS — M62830 Muscle spasm of back: Secondary | ICD-10-CM | POA: Diagnosis not present

## 2022-06-20 DIAGNOSIS — M9901 Segmental and somatic dysfunction of cervical region: Secondary | ICD-10-CM | POA: Diagnosis not present

## 2022-06-20 DIAGNOSIS — M5136 Other intervertebral disc degeneration, lumbar region: Secondary | ICD-10-CM | POA: Diagnosis not present

## 2022-06-20 DIAGNOSIS — M546 Pain in thoracic spine: Secondary | ICD-10-CM | POA: Diagnosis not present

## 2022-06-22 DIAGNOSIS — H90A12 Conductive hearing loss, unilateral, left ear with restricted hearing on the contralateral side: Secondary | ICD-10-CM | POA: Diagnosis not present

## 2022-06-25 DIAGNOSIS — M62830 Muscle spasm of back: Secondary | ICD-10-CM | POA: Diagnosis not present

## 2022-06-25 DIAGNOSIS — M9900 Segmental and somatic dysfunction of head region: Secondary | ICD-10-CM | POA: Diagnosis not present

## 2022-06-25 DIAGNOSIS — M9902 Segmental and somatic dysfunction of thoracic region: Secondary | ICD-10-CM | POA: Diagnosis not present

## 2022-06-25 DIAGNOSIS — M9903 Segmental and somatic dysfunction of lumbar region: Secondary | ICD-10-CM | POA: Diagnosis not present

## 2022-06-25 DIAGNOSIS — M9904 Segmental and somatic dysfunction of sacral region: Secondary | ICD-10-CM | POA: Diagnosis not present

## 2022-06-25 DIAGNOSIS — M5136 Other intervertebral disc degeneration, lumbar region: Secondary | ICD-10-CM | POA: Diagnosis not present

## 2022-06-25 DIAGNOSIS — M9901 Segmental and somatic dysfunction of cervical region: Secondary | ICD-10-CM | POA: Diagnosis not present

## 2022-06-25 DIAGNOSIS — M546 Pain in thoracic spine: Secondary | ICD-10-CM | POA: Diagnosis not present

## 2022-06-25 DIAGNOSIS — M542 Cervicalgia: Secondary | ICD-10-CM | POA: Diagnosis not present

## 2022-07-09 DIAGNOSIS — M5136 Other intervertebral disc degeneration, lumbar region: Secondary | ICD-10-CM | POA: Diagnosis not present

## 2022-07-09 DIAGNOSIS — M542 Cervicalgia: Secondary | ICD-10-CM | POA: Diagnosis not present

## 2022-07-09 DIAGNOSIS — M9902 Segmental and somatic dysfunction of thoracic region: Secondary | ICD-10-CM | POA: Diagnosis not present

## 2022-07-09 DIAGNOSIS — M9900 Segmental and somatic dysfunction of head region: Secondary | ICD-10-CM | POA: Diagnosis not present

## 2022-07-09 DIAGNOSIS — M546 Pain in thoracic spine: Secondary | ICD-10-CM | POA: Diagnosis not present

## 2022-07-09 DIAGNOSIS — M9901 Segmental and somatic dysfunction of cervical region: Secondary | ICD-10-CM | POA: Diagnosis not present

## 2022-07-09 DIAGNOSIS — M9904 Segmental and somatic dysfunction of sacral region: Secondary | ICD-10-CM | POA: Diagnosis not present

## 2022-07-09 DIAGNOSIS — M9903 Segmental and somatic dysfunction of lumbar region: Secondary | ICD-10-CM | POA: Diagnosis not present

## 2022-07-09 DIAGNOSIS — M62830 Muscle spasm of back: Secondary | ICD-10-CM | POA: Diagnosis not present

## 2022-07-23 DIAGNOSIS — M9901 Segmental and somatic dysfunction of cervical region: Secondary | ICD-10-CM | POA: Diagnosis not present

## 2022-07-23 DIAGNOSIS — M9900 Segmental and somatic dysfunction of head region: Secondary | ICD-10-CM | POA: Diagnosis not present

## 2022-07-23 DIAGNOSIS — M542 Cervicalgia: Secondary | ICD-10-CM | POA: Diagnosis not present

## 2022-07-23 DIAGNOSIS — M9902 Segmental and somatic dysfunction of thoracic region: Secondary | ICD-10-CM | POA: Diagnosis not present

## 2022-07-23 DIAGNOSIS — M9904 Segmental and somatic dysfunction of sacral region: Secondary | ICD-10-CM | POA: Diagnosis not present

## 2022-07-23 DIAGNOSIS — M62830 Muscle spasm of back: Secondary | ICD-10-CM | POA: Diagnosis not present

## 2022-07-23 DIAGNOSIS — M5136 Other intervertebral disc degeneration, lumbar region: Secondary | ICD-10-CM | POA: Diagnosis not present

## 2022-07-23 DIAGNOSIS — M546 Pain in thoracic spine: Secondary | ICD-10-CM | POA: Diagnosis not present

## 2022-07-23 DIAGNOSIS — M9903 Segmental and somatic dysfunction of lumbar region: Secondary | ICD-10-CM | POA: Diagnosis not present

## 2022-08-09 DIAGNOSIS — H7412 Adhesive left middle ear disease: Secondary | ICD-10-CM | POA: Diagnosis not present

## 2022-08-09 DIAGNOSIS — H9113 Presbycusis, bilateral: Secondary | ICD-10-CM | POA: Diagnosis not present

## 2022-08-09 DIAGNOSIS — M26623 Arthralgia of bilateral temporomandibular joint: Secondary | ICD-10-CM | POA: Diagnosis not present

## 2022-08-09 DIAGNOSIS — M542 Cervicalgia: Secondary | ICD-10-CM | POA: Diagnosis not present

## 2022-08-21 ENCOUNTER — Ambulatory Visit: Payer: Medicare Other | Admitting: Physician Assistant

## 2022-08-30 DIAGNOSIS — K219 Gastro-esophageal reflux disease without esophagitis: Secondary | ICD-10-CM | POA: Diagnosis not present

## 2022-08-30 DIAGNOSIS — M797 Fibromyalgia: Secondary | ICD-10-CM | POA: Diagnosis not present

## 2022-08-30 DIAGNOSIS — G4700 Insomnia, unspecified: Secondary | ICD-10-CM | POA: Diagnosis not present

## 2022-08-30 DIAGNOSIS — I10 Essential (primary) hypertension: Secondary | ICD-10-CM | POA: Diagnosis not present

## 2022-08-30 DIAGNOSIS — E7800 Pure hypercholesterolemia, unspecified: Secondary | ICD-10-CM | POA: Diagnosis not present

## 2022-08-30 DIAGNOSIS — Z23 Encounter for immunization: Secondary | ICD-10-CM | POA: Diagnosis not present

## 2022-09-20 ENCOUNTER — Encounter: Payer: Self-pay | Admitting: Obstetrics and Gynecology

## 2022-09-20 DIAGNOSIS — N952 Postmenopausal atrophic vaginitis: Secondary | ICD-10-CM | POA: Diagnosis not present

## 2022-09-20 DIAGNOSIS — L900 Lichen sclerosus et atrophicus: Secondary | ICD-10-CM | POA: Diagnosis not present

## 2022-09-20 DIAGNOSIS — M8580 Other specified disorders of bone density and structure, unspecified site: Secondary | ICD-10-CM

## 2022-09-20 DIAGNOSIS — Z4689 Encounter for fitting and adjustment of other specified devices: Secondary | ICD-10-CM | POA: Diagnosis not present

## 2022-09-20 DIAGNOSIS — Z01419 Encounter for gynecological examination (general) (routine) without abnormal findings: Secondary | ICD-10-CM | POA: Diagnosis not present

## 2022-09-20 DIAGNOSIS — N8110 Cystocele, unspecified: Secondary | ICD-10-CM | POA: Diagnosis not present

## 2023-01-23 DIAGNOSIS — Z23 Encounter for immunization: Secondary | ICD-10-CM | POA: Diagnosis not present

## 2023-03-11 DIAGNOSIS — Z1331 Encounter for screening for depression: Secondary | ICD-10-CM | POA: Diagnosis not present

## 2023-03-11 DIAGNOSIS — R202 Paresthesia of skin: Secondary | ICD-10-CM | POA: Diagnosis not present

## 2023-03-11 DIAGNOSIS — I10 Essential (primary) hypertension: Secondary | ICD-10-CM | POA: Diagnosis not present

## 2023-03-11 DIAGNOSIS — Z0000 Encounter for general adult medical examination without abnormal findings: Secondary | ICD-10-CM | POA: Diagnosis not present

## 2023-03-11 DIAGNOSIS — F320 Major depressive disorder, single episode, mild: Secondary | ICD-10-CM | POA: Diagnosis not present

## 2023-03-11 DIAGNOSIS — K219 Gastro-esophageal reflux disease without esophagitis: Secondary | ICD-10-CM | POA: Diagnosis not present

## 2023-03-11 DIAGNOSIS — G4700 Insomnia, unspecified: Secondary | ICD-10-CM | POA: Diagnosis not present

## 2023-03-11 DIAGNOSIS — E669 Obesity, unspecified: Secondary | ICD-10-CM | POA: Diagnosis not present

## 2023-03-11 DIAGNOSIS — M797 Fibromyalgia: Secondary | ICD-10-CM | POA: Diagnosis not present

## 2023-03-11 DIAGNOSIS — E7800 Pure hypercholesterolemia, unspecified: Secondary | ICD-10-CM | POA: Diagnosis not present

## 2023-03-20 DIAGNOSIS — R29890 Loss of height: Secondary | ICD-10-CM | POA: Diagnosis not present

## 2023-03-20 DIAGNOSIS — Z1231 Encounter for screening mammogram for malignant neoplasm of breast: Secondary | ICD-10-CM | POA: Diagnosis not present

## 2023-03-20 DIAGNOSIS — M8588 Other specified disorders of bone density and structure, other site: Secondary | ICD-10-CM | POA: Diagnosis not present

## 2023-04-08 DIAGNOSIS — I10 Essential (primary) hypertension: Secondary | ICD-10-CM | POA: Diagnosis not present

## 2023-04-08 DIAGNOSIS — M797 Fibromyalgia: Secondary | ICD-10-CM | POA: Diagnosis not present

## 2023-04-08 DIAGNOSIS — M810 Age-related osteoporosis without current pathological fracture: Secondary | ICD-10-CM | POA: Diagnosis not present

## 2023-04-08 DIAGNOSIS — F325 Major depressive disorder, single episode, in full remission: Secondary | ICD-10-CM | POA: Diagnosis not present

## 2023-07-16 DIAGNOSIS — I10 Essential (primary) hypertension: Secondary | ICD-10-CM | POA: Diagnosis not present

## 2023-08-05 DIAGNOSIS — F325 Major depressive disorder, single episode, in full remission: Secondary | ICD-10-CM | POA: Diagnosis not present

## 2023-08-05 DIAGNOSIS — E669 Obesity, unspecified: Secondary | ICD-10-CM | POA: Diagnosis not present

## 2023-08-05 DIAGNOSIS — F320 Major depressive disorder, single episode, mild: Secondary | ICD-10-CM | POA: Diagnosis not present

## 2023-08-05 DIAGNOSIS — E7800 Pure hypercholesterolemia, unspecified: Secondary | ICD-10-CM | POA: Diagnosis not present

## 2023-08-05 DIAGNOSIS — I10 Essential (primary) hypertension: Secondary | ICD-10-CM | POA: Diagnosis not present

## 2023-08-14 DIAGNOSIS — I10 Essential (primary) hypertension: Secondary | ICD-10-CM | POA: Diagnosis not present

## 2023-09-04 DIAGNOSIS — E669 Obesity, unspecified: Secondary | ICD-10-CM | POA: Diagnosis not present

## 2023-09-04 DIAGNOSIS — E7800 Pure hypercholesterolemia, unspecified: Secondary | ICD-10-CM | POA: Diagnosis not present

## 2023-09-04 DIAGNOSIS — F320 Major depressive disorder, single episode, mild: Secondary | ICD-10-CM | POA: Diagnosis not present

## 2023-09-04 DIAGNOSIS — I10 Essential (primary) hypertension: Secondary | ICD-10-CM | POA: Diagnosis not present

## 2023-09-04 DIAGNOSIS — F325 Major depressive disorder, single episode, in full remission: Secondary | ICD-10-CM | POA: Diagnosis not present

## 2023-09-10 DIAGNOSIS — H539 Unspecified visual disturbance: Secondary | ICD-10-CM | POA: Diagnosis not present

## 2023-09-10 DIAGNOSIS — I10 Essential (primary) hypertension: Secondary | ICD-10-CM | POA: Diagnosis not present

## 2023-09-10 DIAGNOSIS — L989 Disorder of the skin and subcutaneous tissue, unspecified: Secondary | ICD-10-CM | POA: Diagnosis not present

## 2023-09-10 DIAGNOSIS — M6281 Muscle weakness (generalized): Secondary | ICD-10-CM | POA: Diagnosis not present

## 2023-09-10 DIAGNOSIS — R498 Other voice and resonance disorders: Secondary | ICD-10-CM | POA: Diagnosis not present

## 2023-09-10 DIAGNOSIS — E7800 Pure hypercholesterolemia, unspecified: Secondary | ICD-10-CM | POA: Diagnosis not present

## 2023-09-10 DIAGNOSIS — M797 Fibromyalgia: Secondary | ICD-10-CM | POA: Diagnosis not present

## 2023-09-10 DIAGNOSIS — K5909 Other constipation: Secondary | ICD-10-CM | POA: Diagnosis not present

## 2023-09-10 DIAGNOSIS — E669 Obesity, unspecified: Secondary | ICD-10-CM | POA: Diagnosis not present

## 2023-09-13 DIAGNOSIS — I10 Essential (primary) hypertension: Secondary | ICD-10-CM | POA: Diagnosis not present

## 2023-10-05 DIAGNOSIS — F325 Major depressive disorder, single episode, in full remission: Secondary | ICD-10-CM | POA: Diagnosis not present

## 2023-10-05 DIAGNOSIS — F320 Major depressive disorder, single episode, mild: Secondary | ICD-10-CM | POA: Diagnosis not present

## 2023-10-05 DIAGNOSIS — E7800 Pure hypercholesterolemia, unspecified: Secondary | ICD-10-CM | POA: Diagnosis not present

## 2023-10-05 DIAGNOSIS — I10 Essential (primary) hypertension: Secondary | ICD-10-CM | POA: Diagnosis not present

## 2023-10-05 DIAGNOSIS — E669 Obesity, unspecified: Secondary | ICD-10-CM | POA: Diagnosis not present

## 2023-10-13 DIAGNOSIS — I10 Essential (primary) hypertension: Secondary | ICD-10-CM | POA: Diagnosis not present

## 2023-10-29 DIAGNOSIS — L900 Lichen sclerosus et atrophicus: Secondary | ICD-10-CM | POA: Diagnosis not present

## 2023-10-29 DIAGNOSIS — Z01419 Encounter for gynecological examination (general) (routine) without abnormal findings: Secondary | ICD-10-CM | POA: Diagnosis not present

## 2023-10-29 DIAGNOSIS — N952 Postmenopausal atrophic vaginitis: Secondary | ICD-10-CM | POA: Diagnosis not present

## 2023-10-29 DIAGNOSIS — Z4689 Encounter for fitting and adjustment of other specified devices: Secondary | ICD-10-CM | POA: Diagnosis not present

## 2023-10-29 DIAGNOSIS — N8110 Cystocele, unspecified: Secondary | ICD-10-CM | POA: Diagnosis not present

## 2023-11-04 DIAGNOSIS — E669 Obesity, unspecified: Secondary | ICD-10-CM | POA: Diagnosis not present

## 2023-11-04 DIAGNOSIS — F325 Major depressive disorder, single episode, in full remission: Secondary | ICD-10-CM | POA: Diagnosis not present

## 2023-11-04 DIAGNOSIS — E7800 Pure hypercholesterolemia, unspecified: Secondary | ICD-10-CM | POA: Diagnosis not present

## 2023-11-04 DIAGNOSIS — I10 Essential (primary) hypertension: Secondary | ICD-10-CM | POA: Diagnosis not present

## 2023-11-04 DIAGNOSIS — F320 Major depressive disorder, single episode, mild: Secondary | ICD-10-CM | POA: Diagnosis not present

## 2023-11-12 DIAGNOSIS — I10 Essential (primary) hypertension: Secondary | ICD-10-CM | POA: Diagnosis not present

## 2023-12-05 DIAGNOSIS — E669 Obesity, unspecified: Secondary | ICD-10-CM | POA: Diagnosis not present

## 2023-12-05 DIAGNOSIS — F325 Major depressive disorder, single episode, in full remission: Secondary | ICD-10-CM | POA: Diagnosis not present

## 2023-12-05 DIAGNOSIS — I10 Essential (primary) hypertension: Secondary | ICD-10-CM | POA: Diagnosis not present

## 2023-12-05 DIAGNOSIS — E7800 Pure hypercholesterolemia, unspecified: Secondary | ICD-10-CM | POA: Diagnosis not present

## 2023-12-05 DIAGNOSIS — F320 Major depressive disorder, single episode, mild: Secondary | ICD-10-CM | POA: Diagnosis not present

## 2023-12-11 DIAGNOSIS — H16223 Keratoconjunctivitis sicca, not specified as Sjogren's, bilateral: Secondary | ICD-10-CM | POA: Diagnosis not present

## 2023-12-12 DIAGNOSIS — I10 Essential (primary) hypertension: Secondary | ICD-10-CM | POA: Diagnosis not present

## 2024-01-05 DIAGNOSIS — F325 Major depressive disorder, single episode, in full remission: Secondary | ICD-10-CM | POA: Diagnosis not present

## 2024-01-05 DIAGNOSIS — E669 Obesity, unspecified: Secondary | ICD-10-CM | POA: Diagnosis not present

## 2024-01-05 DIAGNOSIS — I10 Essential (primary) hypertension: Secondary | ICD-10-CM | POA: Diagnosis not present

## 2024-01-05 DIAGNOSIS — F320 Major depressive disorder, single episode, mild: Secondary | ICD-10-CM | POA: Diagnosis not present

## 2024-01-05 DIAGNOSIS — E7800 Pure hypercholesterolemia, unspecified: Secondary | ICD-10-CM | POA: Diagnosis not present

## 2024-01-11 DIAGNOSIS — I10 Essential (primary) hypertension: Secondary | ICD-10-CM | POA: Diagnosis not present

## 2024-02-04 DIAGNOSIS — L820 Inflamed seborrheic keratosis: Secondary | ICD-10-CM | POA: Diagnosis not present

## 2024-02-04 DIAGNOSIS — E7800 Pure hypercholesterolemia, unspecified: Secondary | ICD-10-CM | POA: Diagnosis not present

## 2024-02-04 DIAGNOSIS — L821 Other seborrheic keratosis: Secondary | ICD-10-CM | POA: Diagnosis not present

## 2024-02-04 DIAGNOSIS — I10 Essential (primary) hypertension: Secondary | ICD-10-CM | POA: Diagnosis not present

## 2024-02-04 DIAGNOSIS — F320 Major depressive disorder, single episode, mild: Secondary | ICD-10-CM | POA: Diagnosis not present

## 2024-02-04 DIAGNOSIS — E669 Obesity, unspecified: Secondary | ICD-10-CM | POA: Diagnosis not present

## 2024-02-04 DIAGNOSIS — D239 Other benign neoplasm of skin, unspecified: Secondary | ICD-10-CM | POA: Diagnosis not present

## 2024-02-04 DIAGNOSIS — F325 Major depressive disorder, single episode, in full remission: Secondary | ICD-10-CM | POA: Diagnosis not present

## 2024-02-10 DIAGNOSIS — I10 Essential (primary) hypertension: Secondary | ICD-10-CM | POA: Diagnosis not present

## 2024-03-06 DIAGNOSIS — F320 Major depressive disorder, single episode, mild: Secondary | ICD-10-CM | POA: Diagnosis not present

## 2024-03-06 DIAGNOSIS — F325 Major depressive disorder, single episode, in full remission: Secondary | ICD-10-CM | POA: Diagnosis not present

## 2024-03-06 DIAGNOSIS — E669 Obesity, unspecified: Secondary | ICD-10-CM | POA: Diagnosis not present

## 2024-03-06 DIAGNOSIS — E7800 Pure hypercholesterolemia, unspecified: Secondary | ICD-10-CM | POA: Diagnosis not present

## 2024-03-06 DIAGNOSIS — I10 Essential (primary) hypertension: Secondary | ICD-10-CM | POA: Diagnosis not present

## 2024-03-11 DIAGNOSIS — I10 Essential (primary) hypertension: Secondary | ICD-10-CM | POA: Diagnosis not present

## 2024-03-17 DIAGNOSIS — D1801 Hemangioma of skin and subcutaneous tissue: Secondary | ICD-10-CM | POA: Diagnosis not present

## 2024-03-17 DIAGNOSIS — Z23 Encounter for immunization: Secondary | ICD-10-CM | POA: Diagnosis not present

## 2024-03-17 DIAGNOSIS — L821 Other seborrheic keratosis: Secondary | ICD-10-CM | POA: Diagnosis not present

## 2024-03-17 DIAGNOSIS — D225 Melanocytic nevi of trunk: Secondary | ICD-10-CM | POA: Diagnosis not present

## 2024-03-17 DIAGNOSIS — L578 Other skin changes due to chronic exposure to nonionizing radiation: Secondary | ICD-10-CM | POA: Diagnosis not present

## 2024-03-17 DIAGNOSIS — L719 Rosacea, unspecified: Secondary | ICD-10-CM | POA: Diagnosis not present

## 2024-03-17 DIAGNOSIS — L814 Other melanin hyperpigmentation: Secondary | ICD-10-CM | POA: Diagnosis not present

## 2024-03-17 DIAGNOSIS — L570 Actinic keratosis: Secondary | ICD-10-CM | POA: Diagnosis not present

## 2024-03-17 DIAGNOSIS — L820 Inflamed seborrheic keratosis: Secondary | ICD-10-CM | POA: Diagnosis not present

## 2024-03-17 DIAGNOSIS — Z85828 Personal history of other malignant neoplasm of skin: Secondary | ICD-10-CM | POA: Diagnosis not present

## 2024-03-24 DIAGNOSIS — E7800 Pure hypercholesterolemia, unspecified: Secondary | ICD-10-CM | POA: Diagnosis not present

## 2024-03-24 DIAGNOSIS — M797 Fibromyalgia: Secondary | ICD-10-CM | POA: Diagnosis not present

## 2024-03-24 DIAGNOSIS — E669 Obesity, unspecified: Secondary | ICD-10-CM | POA: Diagnosis not present

## 2024-03-24 DIAGNOSIS — F439 Reaction to severe stress, unspecified: Secondary | ICD-10-CM | POA: Diagnosis not present

## 2024-03-24 DIAGNOSIS — K219 Gastro-esophageal reflux disease without esophagitis: Secondary | ICD-10-CM | POA: Diagnosis not present

## 2024-03-24 DIAGNOSIS — Z0000 Encounter for general adult medical examination without abnormal findings: Secondary | ICD-10-CM | POA: Diagnosis not present

## 2024-03-24 DIAGNOSIS — M810 Age-related osteoporosis without current pathological fracture: Secondary | ICD-10-CM | POA: Diagnosis not present

## 2024-03-24 DIAGNOSIS — G4700 Insomnia, unspecified: Secondary | ICD-10-CM | POA: Diagnosis not present

## 2024-03-24 DIAGNOSIS — I10 Essential (primary) hypertension: Secondary | ICD-10-CM | POA: Diagnosis not present

## 2024-03-24 DIAGNOSIS — Z1331 Encounter for screening for depression: Secondary | ICD-10-CM | POA: Diagnosis not present

## 2024-03-25 DIAGNOSIS — Z1231 Encounter for screening mammogram for malignant neoplasm of breast: Secondary | ICD-10-CM | POA: Diagnosis not present

## 2024-04-05 DIAGNOSIS — I10 Essential (primary) hypertension: Secondary | ICD-10-CM | POA: Diagnosis not present

## 2024-04-05 DIAGNOSIS — E7800 Pure hypercholesterolemia, unspecified: Secondary | ICD-10-CM | POA: Diagnosis not present

## 2024-04-05 DIAGNOSIS — F320 Major depressive disorder, single episode, mild: Secondary | ICD-10-CM | POA: Diagnosis not present

## 2024-04-05 DIAGNOSIS — F325 Major depressive disorder, single episode, in full remission: Secondary | ICD-10-CM | POA: Diagnosis not present

## 2024-04-05 DIAGNOSIS — E669 Obesity, unspecified: Secondary | ICD-10-CM | POA: Diagnosis not present
